# Patient Record
Sex: Female | Born: 1970 | Race: White | Hispanic: No | Marital: Single | Smoking: Former smoker
Health system: Southern US, Community
[De-identification: ages and names within clinical notes are randomized; demographics above are authoritative.]

## PROBLEM LIST (undated history)

## (undated) DIAGNOSIS — M1990 Unspecified osteoarthritis, unspecified site: Secondary | ICD-10-CM

## (undated) DIAGNOSIS — F419 Anxiety disorder, unspecified: Secondary | ICD-10-CM

## (undated) HISTORY — PX: ABDOMINAL HYSTERECTOMY: SHX81

## (undated) HISTORY — DX: Anxiety disorder, unspecified: F41.9

## (undated) HISTORY — DX: Unspecified osteoarthritis, unspecified site: M19.90

---

## 2005-05-04 ENCOUNTER — Emergency Department: Payer: Self-pay | Admitting: Emergency Medicine

## 2008-06-01 ENCOUNTER — Emergency Department: Payer: Self-pay | Admitting: Emergency Medicine

## 2012-01-24 ENCOUNTER — Ambulatory Visit: Payer: Self-pay | Admitting: Family Medicine

## 2013-11-17 ENCOUNTER — Ambulatory Visit (INDEPENDENT_AMBULATORY_CARE_PROVIDER_SITE_OTHER): Payer: No Typology Code available for payment source | Admitting: Podiatry

## 2013-11-17 ENCOUNTER — Encounter (INDEPENDENT_AMBULATORY_CARE_PROVIDER_SITE_OTHER): Payer: Self-pay

## 2013-11-17 ENCOUNTER — Encounter: Payer: Self-pay | Admitting: Podiatry

## 2013-11-17 VITALS — BP 114/69 | HR 64 | Resp 16 | Ht 64.0 in | Wt 170.0 lb

## 2013-11-17 DIAGNOSIS — M6740 Ganglion, unspecified site: Secondary | ICD-10-CM

## 2013-11-17 DIAGNOSIS — M79609 Pain in unspecified limb: Secondary | ICD-10-CM

## 2013-11-17 DIAGNOSIS — B079 Viral wart, unspecified: Secondary | ICD-10-CM

## 2013-11-17 NOTE — Patient Instructions (Signed)

## 2013-11-17 NOTE — Progress Notes (Signed)
   Subjective:    Patient ID: Rachel Lewis, female    DOB: 1970/07/29, 43 y.o.   MRN: [id number]  HPI Comments: i have a place on the bottom of my big toe on my rt foot. Ive had it for over 1 year. Its getting worse. It hurts with pressure and when it gets bigger. i shave it down and i used otc corn remover stuff.  Foot Pain Associated symptoms include coughing.      Review of Systems  Respiratory: Positive for cough.   All other systems reviewed and are negative.      Objective:   Physical Exam        Assessment & Plan:

## 2013-11-17 NOTE — Progress Notes (Signed)
Subjective:     Patient ID: Rachel Lewis, female   DOB: 1971-01-23, 43 y.o.   MRN: [id number]  Foot Pain   patient presents stating I have this bottom my big toe that's been sore and driving me crazy for the last year and also I have this lump on top of my foot that makes it hard for me to wear certain types of shoes that's been present for several years   Review of Systems  All other systems reviewed and are negative.      Objective:   Physical Exam  Nursing note and vitals reviewed. Constitutional: She is oriented to person, place, and time.  Cardiovascular: Intact distal pulses.   Musculoskeletal: Normal range of motion.  Neurological: She is oriented to person, place, and time.  Skin: Skin is warm.   neurovascular status intact with muscle strength adequate and range of motion subtalar and midtarsal joint within normal limits. 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 I've recommended excision of the mass right hallux do to its solitary appearance and drainage of the mass on the dorsum right foot I infiltrated the right hallux 60 mg Xylocaine Marcaine mixture I excised the lesion in toto and he did have appearance of verruca but I placed in formalin and sent for pathological evaluation I applied a small amount of phenol to the base and applied sterile dressing to the dorsal area I did do a proximal nerve block and aspirated with 10 cc syringe and was able to aspirate a cc of gelatinous material consistent with ganglionic cyst and applied sterile dressing to the area with compression  area reappoint as needed and I will go over pathology when received

## 2013-11-18 ENCOUNTER — Telehealth: Payer: Self-pay | Admitting: *Deleted

## 2013-11-18 NOTE — Telephone Encounter (Signed)
Spoke to patient, told patient to continue with her soaking, and take ibuprofen or tylenol , that it should start to feel better if it does not get any better to call us back

## 2013-11-18 NOTE — Telephone Encounter (Signed)
Left message for patient to return call, Rachel Lewis had sent message over that patient had called and dr Charlsie Merlesregal did a procedure yesterday ,and that she was in pain all last night and did not know what to do .

## 2013-12-29 ENCOUNTER — Encounter: Payer: Self-pay | Admitting: Podiatry

## 2014-09-14 DIAGNOSIS — G5793 Unspecified mononeuropathy of bilateral lower limbs: Secondary | ICD-10-CM | POA: Insufficient documentation

## 2014-09-14 DIAGNOSIS — F5104 Psychophysiologic insomnia: Secondary | ICD-10-CM | POA: Insufficient documentation

## 2015-09-28 ENCOUNTER — Other Ambulatory Visit: Payer: Self-pay | Admitting: Neurology

## 2015-09-28 DIAGNOSIS — M79605 Pain in left leg: Principal | ICD-10-CM

## 2015-09-28 DIAGNOSIS — R200 Anesthesia of skin: Secondary | ICD-10-CM

## 2015-09-28 DIAGNOSIS — R202 Paresthesia of skin: Secondary | ICD-10-CM

## 2015-09-28 DIAGNOSIS — M79604 Pain in right leg: Secondary | ICD-10-CM

## 2015-10-19 ENCOUNTER — Ambulatory Visit
Admission: RE | Admit: 2015-10-19 | Discharge: 2015-10-19 | Disposition: A | Discharge status: Home / Self Care | Payer: BLUE CROSS/BLUE SHIELD | Source: Ambulatory Visit | Attending: Neurology | Admitting: Neurology

## 2015-10-19 DIAGNOSIS — M79604 Pain in right leg: Secondary | ICD-10-CM | POA: Diagnosis present

## 2015-10-19 DIAGNOSIS — M4696 Unspecified inflammatory spondylopathy, lumbar region: Secondary | ICD-10-CM | POA: Diagnosis not present

## 2015-10-19 DIAGNOSIS — R202 Paresthesia of skin: Secondary | ICD-10-CM | POA: Diagnosis not present

## 2015-10-19 DIAGNOSIS — M5136 Other intervertebral disc degeneration, lumbar region: Secondary | ICD-10-CM | POA: Insufficient documentation

## 2015-10-19 DIAGNOSIS — M79605 Pain in left leg: Secondary | ICD-10-CM | POA: Diagnosis present

## 2015-10-19 DIAGNOSIS — R200 Anesthesia of skin: Secondary | ICD-10-CM | POA: Diagnosis present

## 2016-01-26 ENCOUNTER — Encounter: Payer: Self-pay | Admitting: Physical Therapy

## 2016-01-26 ENCOUNTER — Ambulatory Visit: Payer: BLUE CROSS/BLUE SHIELD | Attending: Obstetrics and Gynecology | Admitting: Physical Therapy

## 2016-01-26 DIAGNOSIS — R2689 Other abnormalities of gait and mobility: Secondary | ICD-10-CM | POA: Diagnosis present

## 2016-01-26 DIAGNOSIS — M791 Myalgia, unspecified site: Secondary | ICD-10-CM

## 2016-01-26 NOTE — Patient Instructions (Signed)
  Proper body mechanics with getting out of a chair to decrease strain on back  Avoid holding your breath when Getting out of the chair:  Scoot to front part of chair chair Heels behind feet, nose over toes  Inhale like you are smelling roses Exhale to stand    ______  Belly massage (gentle and fingers flat)  3 half circle upward clockwise starting at low R corner by hip  X 3 sets  3 strokes (triangle from hips to belly button) x 3 sets  Breathing like you are smelling soup to work the diaphragm/ relax the pelvic floor)   Zig zag along the scar   ______  Figure -4 stretch R ankle over L L leg straight  3 breaths at work  X 3 days

## 2016-01-27 NOTE — Therapy (Addendum)
[place] [facility] MAIN [facility] SERVICES [address] [place], [place], [address] Phone: [phone]   Fax:  [phone]  Physical Therapy Evaluation  Patient Details  Name: Rachel Lewis MRN: [id number] Date of Birth: 08/03/1970 Referring Provider: Dalbert Garnet  Encounter Date: 01/26/2016      PT End of Session - 02/01/16 1259    Visit Number 1   Number of Visits 12   PT Start Time 1600   PT Stop Time 1700   PT Time Calculation (min) 60 min   Activity Tolerance Patient tolerated treatment well;No increased pain   Behavior During Therapy WFL for tasks assessed/performed      Past Medical History:  Diagnosis Date  . Arthritis    back    Past Surgical History:  Procedure Laterality Date  . ABDOMINAL HYSTERECTOMY     partial   . CESAREAN SECTION      There were no vitals filed for this visit.       Subjective Assessment - 02/01/16 1257    Subjective 1) Pt reported groin/ leg pain on her left side that started 2 years that just came on. Pain at its worst 7/10 with laying in bed and unexpected movements. Leg  "gives out" 3x/ week but has not caused her to fall. Currently pt has been on medication has helped the pain to decrease to 2/10. The leg does not give out on her anymore. Pt would like to wean off o fhte medication and is working with her MD to do so. The pain radiates down R groin and posterior above the knee at the same time and sometimes it occurs separately. 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  Denied urinary incontinence. 2) Pt had constipation prior to this pain. Pt takes Linzess only 2 x week because she does not want to go regularly while working. Pt reports straining.      Pertinent History 7 days in labor and excessive pushing, and one-sided numbness from an epidermal, and her "insides were swelling over her baby's head" which led to an emergency C-section because her baby was no breathing (  1993),  Pt was not able to walk until 12 hr after her labor due to residual numbness from her epidural.    Patient Stated Goals To have legs not stop hurting and stop taking her medicine            [facility] PT Assessment - 02/01/16 1257      Assessment   Medical Diagnosis pelvic pain    Referring Provider Dalbert Garnet     Precautions   Precautions None     Restrictions   Weight Bearing Restrictions No     Balance Screen   Has the patient fallen in the past 6 months No     Prior Function   Level of Independence Independent     Observation/Other Assessments   Observations slumped posture   Other Surveys  --  PDI 6%      Squat   Comments proper form     Sit to Stand   Comments breathholding, poor alignment, grimacing with pain     Posture/Postural Control   Posture Comments lumbopelvic perturbations with leg movements     Palpation   Palpation comment increased scar restrictions over low abdomen with referred pain to back and groin     Bed Mobility   Bed Mobility --  half crunch  [facility] Adult PT Treatment/Exercise - 02/01/16 1257      Therapeutic Activites    Therapeutic Activities --  see pt instructions     Manual Therapy   Manual therapy comments scar massage                      PT Long Term Goals - 02/01/16 1304      PT LONG TERM GOAL #1   Title Pt will decrease her PDI score from  6 %  to < 3 % in order to return to walking after at the end of the work    Time 12   Period Weeks   Status New     PT LONG TERM GOAL #2   Title Pt will report decreased numbness by 50% in intensity  in L leg within < 1 hr of sitting in order to wathching a movie   Time 12   Period Weeks   Status New     PT LONG TERM GOAL #3   Title Pt will demo decreased scar restrictions over her abdomen in orer to promote mobility of her hips to walk and lift trays at work   Time 12   Period Weeks   Status New     PT LONG TERM GOAL #4    Title Pt will report her R leg no longer gives out on her across 2 weeks in order to demo improved strength and to decrease risk for falls.   Time 12   Period Weeks   Status New               Plan - 02/01/16 1303    Clinical Impression Statement Pt is a 45 yo female who c/o R hip/groin pain and constipation which impact her job tasks and QOL. Pt's clinical presentations include abdominal scar restrictions, dyscoordination and strength of deep core mm, and poor body mechanics which places load on her spine and pelvic floor. Pt's hip/groin pain were found to be associated with abdominal scar restrictions and pelvic floor hyperactivity.  Pt's pain with palpation at bulbospongiosus, obt int, ischiocavernosus decreased 10/10 to 7/10 following Tx. Pt also demo'd proper coordination of deep core mm and body mechanics with sit to stand and out of bed t/f.     Rehab Potential Good   PT Frequency 1x / week   PT Duration 12 weeks   PT Treatment/Interventions ADLs/Self Care Home Management;Aquatic Therapy;Functional mobility training;Stair training;Gait training;Neuromuscular re-education;Patient/family education;Manual techniques;Therapeutic exercise;Therapeutic activities;Balance training;Moist Heat;Passive range of motion;Scar mobilization   Consulted and Agree with Plan of Care Patient      Patient will benefit from skilled therapeutic intervention in order to improve the following deficits and impairments:  Abnormal gait, Increased muscle spasms, Decreased safety awareness, Decreased endurance, Decreased activity tolerance, Pain, Increased fascial restricitons, Difficulty walking, Decreased range of motion, Decreased coordination, Decreased balance, Decreased scar mobility, Hypomobility, Decreased mobility, Decreased strength, Postural dysfunction, Improper body mechanics, Impaired flexibility  Visit Diagnosis: Myalgia  Other abnormalities of gait and mobility     Problem List There are  no active problems to display for this patient.   Mariane MastersYeung,Shin Yiing ,PT, DPT, E-RYT  02/01/2016, 1:05 PM   [facility] [address] [place], [place], [address] Phone: [phone]   Fax:  [phone]  Name: Marshia Lyngela A Ellers MRN: [id number] Date of Birth: 04-20-1970

## 2016-02-01 NOTE — Addendum Note (Signed)
Addended by: Mariane MastersYEUNG, SHIN-YIING on: 02/01/2016 01:06 PM   Modules accepted: Orders

## 2016-02-02 ENCOUNTER — Encounter: Payer: BLUE CROSS/BLUE SHIELD | Admitting: Physical Therapy

## 2016-02-09 ENCOUNTER — Ambulatory Visit: Payer: BLUE CROSS/BLUE SHIELD | Attending: Obstetrics and Gynecology | Admitting: Physical Therapy

## 2016-02-09 DIAGNOSIS — R2689 Other abnormalities of gait and mobility: Secondary | ICD-10-CM | POA: Insufficient documentation

## 2016-02-09 DIAGNOSIS — M791 Myalgia, unspecified site: Secondary | ICD-10-CM

## 2016-02-09 DIAGNOSIS — M25512 Pain in left shoulder: Secondary | ICD-10-CM | POA: Diagnosis present

## 2016-02-09 DIAGNOSIS — G8929 Other chronic pain: Secondary | ICD-10-CM | POA: Insufficient documentation

## 2016-02-09 NOTE — Patient Instructions (Signed)
You are now ready to begin training the deep core muscles system: diaphragm, transverse abdominis, pelvic floor . These muscles must work together as a team.           The key to these exercises to train the brain to coordinate the timing of these muscles and to have them turn on for long periods of time to hold you upright against gravity (especially important if you are on your feet all day).These muscles are postural muscles and play a role stabilizing your spine and bodyweight. By doing these repetitions slowly and correctly instead of doing crunches, you will achieve a flatter belly without a lower pooch. You are also placing your spine in a more neutral position and breathing properly which in turn, decreases your risk for problems related to your pelvic floor, abdominal, and low back such as pelvic organ prolapse, hernias, diastasis recti (separation of superficial muscles), disk herniations, spinal fractures. These exercises set a solid foundation for you to later progress to resistance/ strength training with therabands and weights and return to other typical fitness exercises with a stronger deeper core.   Do level 1 : 10 reps Do level 2: 10 reps (left and right = 1 rep) x 3 sets , 2 x day Do not progress to level 3 for 3-4 weeks. You know you are ready when you do not have any rocking of pelvis nor arching in your back    LOG ROLLING out of bed and  ALSO squeeze pelvic floor and abdominal before your cough / sneeze

## 2016-02-10 NOTE — Therapy (Addendum)
[place] [facility] MAIN REHAB SERVICES [address] [place], [place], [address] Phone: [phone]   Fax:  [phone]  Physical Therapy Treatment  Patient Details  Name: Rachel Lewis MRN: [id number] Date of Birth: 05/12/1970 Referring Provider: Beasley  Encounter Date: 02/09/2016      PT End of Session - 02/10/16 1527    Visit Number 2   Number of Visits 12   PT Start Time 1600   PT Stop Time 1700   PT Time Calculation (min) 60 min   Activity Tolerance Patient tolerated treatment well;No increased pain   Behavior During Therapy WFL for tasks assessed/performed      Past Medical History:  Diagnosis Date  . Arthritis    back    Past Surgical History:  Procedure Laterality Date  . ABDOMINAL HYSTERECTOMY     partial   . CESAREAN SECTION      There were no vitals filed for this visit.      Subjective Assessment - 02/09/16 1611    Subjective Pt has stopped taking one of her pain medications and she feels she does not hurt as much any more. She feels 20% better.  Pt plan to contact her MDs regarding weaning off of her meds.  Pt has not felt any L radiating pain nor have felt her leg "giving out" on her since last session one week ago. Pt's constipation has improved from once a month to every other day and she no longer takes Linzess.      Pertinent History 7 days in labor and excessive pushing, and one-sided numbness from an epidermal, and her "insides were swelling over her baby's head" which led to an emergency C-section because her baby was no breathing ( 1993),  Pt was not able to walk until 12 hr after her labor due to residual numbness from her epidural.    Patient Stated Goals To have legs not stop hurting and stop taking her medicine            [facility] PT Assessment - 02/09/16 1657      Palpation   Palpation comment decreased scar restrictions                  Pelvic Floor Special Questions - 02/09/16 1656    Pelvic Floor Internal Exam pt consented verbally without contraindications   Exam Type Vaginal   Palpation increased tensions R ilio/ ischiococcygeus > L,  L obt int     Strength --  lengthening achieved with vical and tactil cuing                        PT Long Term Goals - 02/09/16 1622      PT LONG TERM GOAL #1   Title Pt will decrease her PDI score from  6 %  to < 3 % in order to return to walking after at the end of the work    Time 12   Period Weeks   Status New     PT LONG TERM GOAL #2   Title Pt will report decreased numbness by 50% in intensity  in L leg within < 1 hr of sitting in order to wathching a movie   Time 12   Period Weeks   Status Achieved     PT LONG TERM GOAL #3   Title Pt will demo decreased scar restrictions over her abdomen in orer to promote mobility of her hips   to walk and lift trays at work   Time 12   Period Weeks   Status Achieved     PT LONG TERM GOAL #4   Title Pt will report her L leg no longer gives out on her across 2 weeks in order to demo improved strength and to decrease risk for falls.   Time 12   Period Weeks   Status Partially Met     PT LONG TERM GOAL #5   Title Pt will report sitting for < 1.5  hrs in a car ride with decreased pain by 50% in order to travel before pit stops   Time 12   Period Weeks   Status New               Plan - 02/09/16 1621    Clinical Impression Statement Pt has not felt any L radiating pain nor have felt her leg "giving out" on her since last session one week ago. Pt's constipation has improved from once a month to every other day and she no longer takes Linzess. Today, pt showed decreased pelvic floor tensions following internal manual Tx. Pt continues to benefit from skilled PT.    Rehab Potential Good   PT Frequency 1x / week   PT Duration 12 weeks   PT Treatment/Interventions ADLs/Self Care Home Management;Aquatic Therapy;Functional mobility training;Stair training;Gait  training;Neuromuscular re-education;Patient/family education;Manual techniques;Therapeutic exercise;Therapeutic activities;Balance training;Moist Heat;Passive range of motion;Scar mobilization   Consulted and Agree with Plan of Care Patient      Patient will benefit from skilled therapeutic intervention in order to improve the following deficits and impairments:  Abnormal gait, Increased muscle spasms, Decreased safety awareness, Decreased endurance, Decreased activity tolerance, Pain, Increased fascial restricitons, Difficulty walking, Decreased range of motion, Decreased coordination, Decreased balance, Decreased scar mobility, Hypomobility, Decreased mobility, Decreased strength, Postural dysfunction, Improper body mechanics, Impaired flexibility  Visit Diagnosis: Myalgia  Other abnormalities of gait and mobility     Problem List There are no active problems to display for this patient.   ,Shin Yiing ,PT, DPT, E-RYT  02/10/2016, 3:28 PM  [facility] [facility] MAIN REHAB SERVICES [address] [place], [place], [address] Phone: [phone]   Fax:  [phone]  Name: Vertie A Aitken MRN: [id number] Date of Birth: 06/15/1970    

## 2016-02-16 ENCOUNTER — Ambulatory Visit: Payer: BLUE CROSS/BLUE SHIELD | Admitting: Physical Therapy

## 2016-02-16 DIAGNOSIS — M791 Myalgia, unspecified site: Secondary | ICD-10-CM

## 2016-02-16 DIAGNOSIS — R2689 Other abnormalities of gait and mobility: Secondary | ICD-10-CM

## 2016-02-16 NOTE — Therapy (Signed)
[place] MAIN [facility] SERVICES [address] [place], [place], [address] Phone: [phone]   Fax:  [phone]  Physical Therapy Treatment  Patient Details  Name: Rachel Lewis MRN: [id number] Date of Birth: 1970-10-26 Referring Provider: Leafy Ro  Encounter Date: 02/16/2016      PT End of Session - 02/16/16 1704    Visit Number 3   Number of Visits 12   PT Start Time 3244   PT Stop Time 0102   PT Time Calculation (min) 61 min   Activity Tolerance Patient tolerated treatment well;No increased pain   Behavior During Therapy WFL for tasks assessed/performed      Past Medical History:  Diagnosis Date  . Arthritis    back    Past Surgical History:  Procedure Laterality Date  . ABDOMINAL HYSTERECTOMY     partial   . CESAREAN SECTION      There were no vitals filed for this visit.      Subjective Assessment - 02/16/16 1608    Subjective Pt has not had any radiating LBP for the past 2 weeks. Pt talked to her MDs and they said pt can wean off o fthe meds. After last session, pt felt "good" and slept well. Across the past week, pt had one night where her pain felt intense. Two days ago, pt noticed a sharp pain in the stomach that comes and goes and she also notices her back hurts as well but has not radiating pain. Pt had daily bowel movements the past week except yesterday because she ate cheese.      Pertinent History 7 days in labor and excessive pushing, and one-sided numbness from an epidermal, and her "insides were swelling over her baby's head" which led to an emergency C-section because her baby was no breathing ( 1993),  Pt was not able to walk until 12 hr after her labor due to residual numbness from her epidural.             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      Palpation   SI assessment  increased mm/ fascial tensions over iliac crest  medial and lateral edge into glut medius R   iliacus and glut med R   Palpation comment decreased scar restrictions                     [facility] Adult PT Treatment/Exercise - 02/16/16 1653      Therapeutic Activites    Therapeutic Activities --  see pt instructions     Manual Therapy   Manual therapy comments STM , fascial release, lower trunk twist, on R iliac crest medial and lateral edge                 PT Education - 02/16/16 1704    Education provided Yes   Education Details HEP   Person(s) Educated Patient   Methods Explanation;Demonstration;Tactile cues;Verbal cues;Handout   Comprehension Verbalized understanding;Returned demonstration             PT Long Term Goals - 02/09/16 1622      PT LONG TERM GOAL #1   Title Pt will decrease her Wright score from  6 %  to < 3 % in order to return to walking after at the end of the work    Time 12   Period Weeks  Status New     PT LONG TERM GOAL #2   Title Pt will report decreased numbness by 50% in intensity  in L leg within < 1 hr of sitting in order to wathching a movie   Time 12   Period Weeks   Status Achieved     PT LONG TERM GOAL #3   Title Pt will demo decreased scar restrictions over her abdomen in orer to promote mobility of her hips to walk and lift trays at work   Time 12   Period Weeks   Status Achieved     PT LONG TERM GOAL #4   Title Pt will report her L leg no longer gives out on her across 2 weeks in order to demo improved strength and to decrease risk for falls.   Time 12   Period Weeks   Status Partially Met     PT LONG TERM GOAL #5   Title Pt will report sitting for < 1.5  hrs in a car ride with decreased pain by 50% in order to travel before pit stops   Time 12   Period Weeks   Status New               Plan - 02/16/16 1705    Clinical Impression Statement Pt reports feeling 80% improvement with her R hip sharp pain and LBP. Pt demo'd decreased fascial and mmt  ensions along iliacus and glut med on R following Tx. Pt demo'd proper body mechanics with work activities w/ lifting to minimize  back strain.  Pt will continue to require skilled PT.     Rehab Potential Good   PT Frequency 1x / week   PT Duration 12 weeks   PT Treatment/Interventions ADLs/Self Care Home Management;Aquatic Therapy;Functional mobility training;Stair training;Gait training;Neuromuscular re-education;Patient/family education;Manual techniques;Therapeutic exercise;Therapeutic activities;Balance training;Moist Heat;Passive range of motion;Scar mobilization   Consulted and Agree with Plan of Care Patient      Patient will benefit from skilled therapeutic intervention in order to improve the following deficits and impairments:  Abnormal gait, Increased muscle spasms, Decreased safety awareness, Decreased endurance, Decreased activity tolerance, Pain, Increased fascial restricitons, Difficulty walking, Decreased range of motion, Decreased coordination, Decreased balance, Decreased scar mobility, Hypomobility, Decreased mobility, Decreased strength, Postural dysfunction, Improper body mechanics, Impaired flexibility  Visit Diagnosis: Myalgia  Other abnormalities of gait and mobility     Problem List There are no active problems to display for this patient.   Jerl Mina ,PT, DPT, E-RYT  02/16/2016, 5:08 PM  [place] MAIN [facility] SERVICES [address] [place], [place], [address] Phone: [phone]   Fax:  [phone]  Name: Rachel Lewis MRN: [id number] Date of Birth: 06/07/1970

## 2016-02-16 NOTE — Patient Instructions (Addendum)
Lower trunk twist :  Lift hips up, scoot to the R, drop knees to L  5 breaths    _________  Continue with 30 reps of deep core level  2 x day   ________  Exhale with lifting trays at work from mini squat or semi tandem stance (one leg  forward knee above ankle) and have equal weight on both legs  Practice mini squat 10 reps  Exhale thru the nose on rise

## 2016-02-28 ENCOUNTER — Ambulatory Visit: Payer: BLUE CROSS/BLUE SHIELD | Admitting: Physical Therapy

## 2016-02-28 DIAGNOSIS — G8929 Other chronic pain: Secondary | ICD-10-CM

## 2016-02-28 DIAGNOSIS — R2689 Other abnormalities of gait and mobility: Secondary | ICD-10-CM

## 2016-02-28 DIAGNOSIS — M791 Myalgia, unspecified site: Secondary | ICD-10-CM

## 2016-02-28 DIAGNOSIS — M25512 Pain in left shoulder: Secondary | ICD-10-CM

## 2016-02-28 NOTE — Patient Instructions (Signed)
  Clam Shell 45 Degrees   Lying on Right side  with hips and knees bent 45, one pillow between knees and ankles. Lift knee with exhale. Be sure pelvis does not roll backward. Do not arch back. Do 10 times, each leg, 2 times per day.   Left leg only     http://ss.exer.us/75   Copyright  VHI. All rights reserved.   ____________________  Royetta CarSidestepping and mini squat 20 ft x 2 day  Make sure you can still see your toes in mini squat, exhale as you rise   ____________________  Standing with red band at ankles, hand on wall, facing perpendicular to wall.  Step toe tips in a diagonal pattern while standing on other leg (unlocked knee)   10 reps x 2 sets each leg

## 2016-02-29 NOTE — Therapy (Addendum)
[facility] [address] [place], [place], [address] Phone: [phone]   Fax:  [phone]  Physical Therapy Treatment/ Progress Note   Patient Details  Name: Rachel Lewis MRN: [id number] Date of Birth: 1971-03-28 Referring Provider: Dalbert GarnetBeasley  Encounter Date: 02/28/2016      PT End of Session - 02/28/16 1646    Visit Number 4   Number of Visits 12   PT Start Time 1615   PT Stop Time 1645   PT Time Calculation (min) 30 min   Activity Tolerance Patient tolerated treatment well;No increased pain   Behavior During Therapy WFL for tasks assessed/performed      Past Medical History:  Diagnosis Date  . Arthritis    back    Past Surgical History:  Procedure Laterality Date  . ABDOMINAL HYSTERECTOMY     partial   . CESAREAN SECTION      There were no vitals filed for this visit.      Subjective Assessment - 02/28/16 1701    Subjective Pt has not had any pain the past week and she has decreased her pain medications as guided by her MD. Pt expressed her excitement bbecause she is no longer feeling side effectrs from her medications is no longer affecting her memory at work. Pt is pleased. Pt states she has to leave early from today's appt .    Pertinent History 7 days in labor and excessive pushing, and one-sided numbness from an epidermal, and her "insides were swelling over her baby's head" which led to an emergency C-section because her baby was no breathing ( 1993),  Pt was not able to walk until 12 hr after her labor due to residual numbness from her epidural.             [facility] - 02/29/16 1023      Squat   Comments requried cues for proper form     Strength   Overall Strength Comments hip abd 4/5 L, 5/5 R. glut max 5/5 B , thoracolumbar with scaption L UE limited in full ROM into scaption due to pt's c/o shoulder issues for over a year                     [facility] PT  Treatment/Exercise - 02/29/16 1021      Therapeutic Activites    Therapeutic Activities --  see pt instruction     Neuro Re-ed    Neuro Re-ed Details  see pt instruction                PT Education - 02/28/16 1646    Education provided Yes   Education Details HEP   Person(s) Educated Patient   Methods Explanation;Demonstration;Tactile cues;Verbal cues;Handout   Comprehension Verbalized understanding;Returned demonstration             PT Long Term Goals - 02/28/16 1624      PT LONG TERM GOAL #1   Title Pt will decrease her PDI score from  6 %  to < 3 % in order to return to walking after at the end of the work  (11/21: 0%)    Time 12   Period Weeks   Status Achieved     PT LONG TERM GOAL #2   Title Pt will report decreased numbness by 50% in intensity  in L leg within < 1 hr of sitting in order to wathching a movie  Time 12   Period Weeks   Status Achieved     PT LONG TERM GOAL #3   Title Pt will demo decreased scar restrictions over her abdomen in orer to promote mobility of her hips to walk and lift trays at work   Time 12   Period Weeks   Status Achieved     PT LONG TERM GOAL #4   Title Pt will report her L leg no longer gives out on her across 2 weeks in order to demo improved strength and to decrease risk for falls.   Time 12   Period Weeks   Status Achieved     PT LONG TERM GOAL #5   Title Pt will report sitting for > 1.5  hrs in a car ride with decreased pain by 50% in order to travel before pit stops   Time 12   Period Weeks   Status Achieved     Additional Long Term Goals   Additional Long Term Goals Yes     PT LONG TERM GOAL #6   Title Pt will demo full shoulder abduction ROM from ~140 deg to 180 deg  on LUE in order to progress to thoralumbar strengthening to lift trays repeated at work   Time 12   Period Weeks   Status New               Plan - 02/29/16 1025    Clinical Impression Statement Pt has achieved 5/6 goals. Pt  had no pain across the past week and no radiating pain across the past 3 weeks.Pt states she has decreased her medications as guided by her MD. Pt is progressing well towards her goals and continues to require skilled PT to build her strength in her LLE/ hip.  PT added L shoulder pain to her recert because pt demo'd ROM  limitation with thoracolumbar strengthen strengthening exercises. Plan to assess next visit the cause for her L shoulder pain and ROM limitation. 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    Rehab Potential Good   PT Frequency 1x / week   PT Duration 12 weeks   PT Treatment/Interventions ADLs/Self Care Home Management;Aquatic Therapy;Functional mobility training;Stair training;Gait training;Neuromuscular re-education;Patient/family education;Manual techniques;Therapeutic exercise;Therapeutic activities;Balance training;Moist Heat;Passive range of motion;Scar mobilization   Consulted and Agree with Plan of Care Patient      Patient will benefit from skilled therapeutic intervention in order to improve the following deficits and impairments:  Abnormal gait, Increased muscle spasms, Decreased safety awareness, Decreased endurance, Decreased activity tolerance, Pain, Increased fascial restricitons, Difficulty walking, Decreased range of motion, Decreased coordination, Decreased balance, Decreased scar mobility, Hypomobility, Decreased mobility, Decreased strength, Postural dysfunction, Improper body mechanics, Impaired flexibility  Visit Diagnosis: Myalgia  Other abnormalities of gait and mobility  Chronic left shoulder pain     Problem List There are no active problems to display for this patient.   Mariane MastersYeung,Shin Yiing ,PT, DPT, E-RYT  02/29/2016, 10:27 AM  [facility] [address] [place], [place], [address] Phone: [phone]   Fax:   [phone]  Name: Rachel Lyngela A Mura MRN: [id number] Date of Birth: January 08, 1971

## 2016-03-15 ENCOUNTER — Ambulatory Visit: Payer: BLUE CROSS/BLUE SHIELD | Attending: Obstetrics and Gynecology | Admitting: Physical Therapy

## 2016-03-15 DIAGNOSIS — R2689 Other abnormalities of gait and mobility: Secondary | ICD-10-CM | POA: Insufficient documentation

## 2016-03-15 DIAGNOSIS — G8929 Other chronic pain: Secondary | ICD-10-CM | POA: Insufficient documentation

## 2016-03-15 DIAGNOSIS — M25512 Pain in left shoulder: Secondary | ICD-10-CM | POA: Insufficient documentation

## 2016-03-15 DIAGNOSIS — M791 Myalgia, unspecified site: Secondary | ICD-10-CM

## 2016-03-15 NOTE — Therapy (Signed)
[place] MAIN [facility] SERVICES [address] [place], [place], [address] Phone: [phone]   Fax:  [phone]  Physical Therapy Treatment  Patient Details  Name: Rachel Lewis MRN: [id number] Date of Birth: [birth year] Referring Provider: Leafy Ro  Encounter Date: 03/15/2016      PT End of Session - 03/15/16 2058    Visit Number 4   Number of Visits 12   Date for PT Re-Evaluation 05/30/16   PT Start Time 4235   PT Stop Time 1655   PT Time Calculation (min) 50 min   Activity Tolerance Patient tolerated treatment well;No increased pain   Behavior During Therapy WFL for tasks assessed/performed      Past Medical History:  Diagnosis Date  . Arthritis    back    Past Surgical History:  Procedure Laterality Date  . ABDOMINAL HYSTERECTOMY     partial   . CESAREAN SECTION      There were no vitals filed for this visit.      Subjective Assessment - 03/15/16 1612    Subjective Pt has no pain for the past two weeks and she has been off of her pain medication for one week.    Pertinent History 7 days in labor and excessive pushing, and one-sided numbness from an epidermal, and her "insides were swelling over her baby's head" which led to an emergency C-section because her baby was no breathing ( 1993),  Pt was not able to walk until 12 hr after her labor due to residual numbness from her epidural.             [facility] PT Assessment - 03/15/16 1635      ROM / Strength   AROM / PROM / Strength --  supine: UE shoulder 90deg pre Tx, 120 deg post Tx     AROM   Overall AROM Comments seated: unable to place hand behind back      Palpation   Spinal mobility significantly increased mm tensions along upper trap, levator, scalenes on L and p! with inferior mob at 1st rib, increased mm tensions at midtrap and lower trap  and paraspinals at thoracic spine L > R       Hawkins-Kennedy test   Findings Negative     Lift-Off test   Findings  Negative     Empty Can test   Findings Negative     Full Can test   Findings Negative                     [facility] Adult PT Treatment/Exercise - 03/15/16 1652      Therapeutic Activites    Therapeutic Activities --  see pt instructions     Modalities   Modalities --  heat pack 7 min over back, skin intact post Tx while pt was educated on open book exercise     Manual Therapy   Manual therapy comments inferior mob at 1st rib, distraction at lower cervical spine, STM with MWM at mid/lower trap/ and paraspinals                 PT Education - 03/15/16 2058    Education provided Yes   Education Details HEP   Person(s) Educated Patient   Methods Explanation;Demonstration;Tactile cues;Verbal cues;Handout   Comprehension Returned demonstration;Verbalized understanding             PT Long Term Goals - 03/15/16 1703      PT LONG TERM GOAL #  1   Title Pt will decrease her Plato score from  6 %  to < 3 % in order to return to walking after at the end of the work  (11/21: 0%)    Time 12   Period Weeks   Status Achieved     PT LONG TERM GOAL #2   Title Pt will report decreased numbness by 50% in intensity  in L leg within < 1 hr of sitting in order to wathching a movie   Time 12   Period Weeks   Status Achieved     PT LONG TERM GOAL #3   Title Pt will demo decreased scar restrictions over her abdomen in orer to promote mobility of her hips to walk and lift trays at work   Time 12   Period Weeks   Status Achieved     PT LONG TERM GOAL #4   Title Pt will report her L leg no longer gives out on her across 2 weeks in order to demo improved strength and to decrease risk for falls.   Time 12   Period Weeks   Status Achieved     PT LONG TERM GOAL #5   Title Pt will report sitting for > 1.5  hrs in a car ride with decreased pain by 50% in order to travel before pit stops   Time 12   Period Weeks   Status Achieved     Additional Long Term Goals    Additional Long Term Goals Yes     PT LONG TERM GOAL #6   Title Pt will demo full shoulder abduction ROM from ~140 deg to 180 deg  on LUE in order to progress to thoralumbar strengthening to lift trays repeated at work   Time 12   Period Weeks   Status Partially Met     PT LONG TERM GOAL #7   Title Pt will be able to place her L hand behind back at the level of L1 or level in order to don bra    Time 12   Period Weeks   Status New     PT LONG TERM GOAL #8   Title Pt will decrease her quick dash score from 57 pts to < 50pt in order to return to ADLs at home and at work   Time [place] - 03/15/16 2059    Clinical Impression Statement Pt continues to report of no pain for 2 weeks now and has not needed to take her pain medication for 1 week. Addressed pt's limited shoulder abduction ROM and shoulder pain today. Rotator cuff or capsular injury is unlikely 2/2 negative special test. 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 Following manual Tx that addressed decreasing mm tensions, pt demo'd increased ROM from ~90d eg to ~120 deg on LUE.  Pt will continue to require skilled PT to help pt  increase ROM to reach behind back and to increase ROM further for functional activities. Pt will also required PT to strengthen shoulder/back complex and proper shoulder motor control w/ lifting to minimize overuse.  Plan to reassess hip strength at next session. Pt will be ready to strengthen thoracolumbar system when pt shows decreased mm tensions in shoulders and back.   Rehab Potential Good   PT Frequency 1x / week  PT Duration 12 weeks   PT Treatment/Interventions ADLs/Self Care Home Management;Aquatic Therapy;Functional mobility training;Stair training;Gait training;Neuromuscular re-education;Patient/family education;Manual techniques;Therapeutic exercise;Therapeutic activities;Balance  training;Moist Heat;Passive range of motion;Scar mobilization   Consulted and Agree with Plan of Care Patient      Patient will benefit from skilled therapeutic intervention in order to improve the following deficits and impairments:  Abnormal gait, Increased muscle spasms, Decreased safety awareness, Decreased endurance, Decreased activity tolerance, Pain, Increased fascial restricitons, Difficulty walking, Decreased range of motion, Decreased coordination, Decreased balance, Decreased scar mobility, Hypomobility, Decreased mobility, Decreased strength, Postural dysfunction, Improper body mechanics, Impaired flexibility  Visit Diagnosis: Myalgia  Other abnormalities of gait and mobility  Chronic left shoulder pain     Problem List There are no active problems to display for this patient.   Jerl Mina ,PT, DPT, E-RYT  03/15/2016, 9:05 PM  [place] MAIN [facility] SERVICES [address] [place], [place], [address] Phone: [phone]   Fax:  [phone]  Name: Rachel Lewis MRN: [id number] Date of Birth: 02/16/1971

## 2016-03-15 NOTE — Patient Instructions (Signed)
  Midback muscle stretches:  ______________________________________  1. Open book (handout) with pillow between legs and behind back  15 reps   2. Cross over stretches On your back, knee back:  A. Hug yourself (each arm over other)  3 breaths     B. Wrist over other, interlace your hands, "brush" fists from belly upward towards chin, then sweep back down towards belly, straighten elbow and raise wrists towards ceiling to feel mib back muscles stretch  3x each side     3. Restorative pose  with yoga blocks to release midback back tensions   One block vertically placed between shoulder blades and the other is sitting on its longerside perpendicular to support back of the head   untuck the hips to lengthen the back   knees bent, feet hip width apart   Gently moves knees to L ~ 20 deg to  alow trunk to fall over edge of block and to feel muscles between the shoulder blades relax  Do other side with knees    4. Snow angles  Hands by your side, palms face up, dragging your whole arms over bed like you are making snow angels With breathing  5x

## 2016-03-29 ENCOUNTER — Ambulatory Visit: Payer: BLUE CROSS/BLUE SHIELD | Admitting: Physical Therapy

## 2016-03-29 DIAGNOSIS — G8929 Other chronic pain: Secondary | ICD-10-CM

## 2016-03-29 DIAGNOSIS — M791 Myalgia, unspecified site: Secondary | ICD-10-CM

## 2016-03-29 DIAGNOSIS — R2689 Other abnormalities of gait and mobility: Secondary | ICD-10-CM

## 2016-03-29 DIAGNOSIS — M25512 Pain in left shoulder: Secondary | ICD-10-CM

## 2016-03-29 NOTE — Patient Instructions (Addendum)
Seated stretches: in a seated position with feet planted on ground under knees  _Raising arm overhead, lower the shoulder down first then elbows  3 reps  _Self hug and then elbows overhead like you are taking off a sweater   _Press hand down to chair , shoulders squeeze together and downward, chin tucks , 3 sec, x 5 reps.    _____________  Zadie CleverlyLaying down: Snow angles: Shoulder pressed downward away from the ears, Chin back   This sequence gets your arm higher   10 reps   _____________ Functional position practice: Stand by the wall  Fingers against the wall Drag the fingers upwards   When lowering (like when you take a cup down from the shelf at work)   first lower shoulders away from the ears and then elbows down, hand lowers   5 reps both arms    _____________  Work stretch:  Warrior I : Feet are hip width apart, one behind like you are on ski tracks, front knee bent over ankle but not more forward then the ankle.  Make sure 50% weight is in the front foot/leg , 50% weight is the back foot/ leg   Arms up like "V" , drop shoulders down  Palms together at the hears  Extended side angle :  Feet are hip width apart, L foot one behind like you are on ski tracks,  R knee bent over ankle but not more forward then the ankle.  Make sure 50% weight is in the front foot/leg , 50% weight is the back foot/ leg    Rest R forearm lightly on top of thigh,  L hand on L hip.  Inhale lengthen spine,   Exhale turn navel to the L then the ribcage turns, look at the other wall.  Keep maintaining  50% weight is in the front foot/leg , 50% weight is the back foot/ leg  And make sure the front knee is still pointed in the toe line of the 2nd toe.   3 breaths here.    ____________  Try to sleep on your back, pillow under knees.   Keep pillow lined up by your side in case you roll over onto belly.

## 2016-03-30 NOTE — Therapy (Signed)
[place] MAIN [facility] SERVICES [address] [place], [place], [address] Phone: [phone]   Fax:  [phone]  Physical Therapy Treatment  Patient Details  Name: Rachel Lewis MRN: [id number] Date of Birth: April 21, 1970 Referring Provider: Leafy Ro  Encounter Date: 03/29/2016      PT End of Session - 03/29/16 1616    Visit Number 5   Date for PT Re-Evaluation 05/30/16   PT Start Time 3536   PT Stop Time 1443   PT Time Calculation (min) 52 min   Activity Tolerance Patient tolerated treatment well;No increased pain   Behavior During Therapy WFL for tasks assessed/performed      Past Medical History:  Diagnosis Date  . Arthritis    back    Past Surgical History:  Procedure Laterality Date  . ABDOMINAL HYSTERECTOMY     partial   . CESAREAN SECTION      There were no vitals filed for this visit.      Subjective Assessment - 03/29/16 1617    Subjective Pt has reported she no longer takes medication for pain in her L buttock pain. Her shoulder pain was getting better by 35% after last session. Pt did have an increased pain in the L arm after moving her arm in the wrong way reaching/ throw her arm up which dissipated after 1 hour. Pt avoided moving her arm. Today this arm no longer hurts and she avoids to lifting it and wanting to her the knots of muscles tensions out of both shoulder.  Pt feels she has gotten weakness in her arm after she moved her arm which felt like "her arm got ripped out of socket".  Denied numbness and tingling. Pt reports she still having sleep issues and she prefers to sleep on her belly.    Pertinent History 7 days in labor and excessive pushing, and one-sided numbness from an epidermal, and her "insides were swelling over her baby's head" which led to an emergency C-section because her baby was no breathing ( 1993),  Pt was not able to walk until 12 hr after her labor due to residual numbness from her epidural.              [facility] PT Assessment - 03/30/16 1817      Coordination   Gross Motor Movements are Fluid and Coordinated --  poor scapular control, deppression coordination delayed     AROM   Overall AROM  --  pain @ BUE abd 90deg, cued for scap depress/cervical retract   Overall AROM Comments seated:  Lthumb behind L1, R thumb T10      Strength   Overall Strength Comments MMT UE 4/5      Hawkins-Kennedy test   Findings Negative     Lift-Off test   Findings Negative     Empty Can test   Findings Negative     Full Can test   Findings Negative       Decreased upper trap and midback mm tensions compared to last session               [facility] Adult PT Treatment/Exercise - 03/30/16 1818      Therapeutic Activites    Therapeutic Activities --  see pt instructions     Neuro Re-ed    Neuro Re-ed Details  scapular motor control training                PT Education - 03/29/16 1633  Education provided Yes   Education Details HEP   Person(s) Educated Patient   Methods Explanation;Demonstration;Tactile cues;Verbal cues;Handout   Comprehension Verbalized understanding;Returned demonstration;Verbal cues required;Tactile cues required             PT Long Term Goals - 03/30/16 1824      PT LONG TERM GOAL #1   Title Pt will decrease her [place] score from  6 %  to < 3 % in order to return to walking after at the end of the work  (11/21: 0%)    Time 12   Period Weeks   Status Achieved     PT LONG TERM GOAL #2   Title Pt will report decreased numbness by 50% in intensity  in L leg within < 1 hr of sitting in order to wathching a movie   Time 12   Period Weeks   Status Achieved     PT LONG TERM GOAL #3   Title Pt will demo decreased scar restrictions over her abdomen in orer to promote mobility of her hips to walk and lift trays at work   Time 12   Period Weeks   Status Achieved     PT LONG TERM GOAL #4   Title Pt will report her L leg no longer  gives out on her across 2 weeks in order to demo improved strength and to decrease risk for falls.   Time 12   Period Weeks   Status Achieved     PT LONG TERM GOAL #5   Title Pt will report sitting for > 1.5  hrs in a car ride with decreased pain by 50% in order to travel before pit stops   Time 12   Period Weeks   Status Achieved     PT LONG TERM GOAL #6   Title Pt will demo full shoulder abduction ROM from ~140 deg to 180 deg  on LUE in order to progress to thoralumbar strengthening to lift trays repeated at work   Time 12   Period Weeks   Status Partially Met     PT LONG TERM GOAL #7   Title Pt will be able to place her L hand behind back at the level of L1 or level in order to don bra    Time 12   Period Weeks   Status Achieved     PT LONG TERM GOAL #8   Title Pt will decrease her quick dash score from 57 pts to < 50pt in order to return to ADLs at home and at work   Time 12   Period Weeks   Status On-going               Plan - 03/30/16 1818    Clinical Impression Statement Pt continues to have no LBP and has not been taking her pain meds for 2 weeks. Despite pt's c/o shoulder weakness, assessment did not show decreased strength and rotator cuff tests were neg. Pt has shown increased ability to reach behind her back above bra strap level which is a significant improvement compared to last session. While pt showed increased UE ROM and decreased mm upper/midback tensions, pt showed poor scapular motor control with functional work tasks.  Suspect pt's shoulder issue will continue to improve with scapular motor control training. PT plans to monitor pt's progress and has not completely r/o capsular injury.   Pt continues to benefit skilled PT.    Rehab Potential Good   PT Frequency  1x / week   PT Duration 12 weeks   PT Treatment/Interventions ADLs/Self Care Home Management;Aquatic Therapy;Functional mobility training;Stair training;Gait training;Neuromuscular  re-education;Patient/family education;Manual techniques;Therapeutic exercise;Therapeutic activities;Balance training;Moist Heat;Passive range of motion;Scar mobilization   Consulted and Agree with Plan of Care Patient      Patient will benefit from skilled therapeutic intervention in order to improve the following deficits and impairments:  Abnormal gait, Increased muscle spasms, Decreased safety awareness, Decreased endurance, Decreased activity tolerance, Pain, Increased fascial restricitons, Difficulty walking, Decreased range of motion, Decreased coordination, Decreased balance, Decreased scar mobility, Hypomobility, Decreased mobility, Decreased strength, Postural dysfunction, Improper body mechanics, Impaired flexibility  Visit Diagnosis: Myalgia  Other abnormalities of gait and mobility  Chronic left shoulder pain     Problem List There are no active problems to display for this patient.   Jerl Mina 03/30/2016, 6:25 PM  [place] MAIN [facility] SERVICES [address] [place], [place], [address] Phone: [phone]   Fax:  [phone]  Name: Rachel Lewis MRN: [id number] Date of Birth: March 02, 1971

## 2016-04-19 ENCOUNTER — Encounter: Payer: BLUE CROSS/BLUE SHIELD | Admitting: Physical Therapy

## 2016-05-03 ENCOUNTER — Ambulatory Visit: Admit: 2016-05-03 | Payer: Self-pay | Admitting: Gastroenterology

## 2016-05-03 SURGERY — COLONOSCOPY WITH PROPOFOL
Anesthesia: General

## 2016-08-09 ENCOUNTER — Other Ambulatory Visit: Payer: Self-pay | Admitting: Obstetrics and Gynecology

## 2016-08-09 DIAGNOSIS — Z1231 Encounter for screening mammogram for malignant neoplasm of breast: Secondary | ICD-10-CM

## 2016-09-04 ENCOUNTER — Ambulatory Visit
Admission: RE | Admit: 2016-09-04 | Discharge: 2016-09-04 | Disposition: A | Discharge status: Home / Self Care | Payer: BLUE CROSS/BLUE SHIELD | Source: Ambulatory Visit | Attending: Obstetrics and Gynecology | Admitting: Obstetrics and Gynecology

## 2016-09-04 DIAGNOSIS — Z1231 Encounter for screening mammogram for malignant neoplasm of breast: Secondary | ICD-10-CM | POA: Diagnosis not present

## 2016-11-29 DIAGNOSIS — M5134 Other intervertebral disc degeneration, thoracic region: Secondary | ICD-10-CM | POA: Insufficient documentation

## 2016-11-29 DIAGNOSIS — G8929 Other chronic pain: Secondary | ICD-10-CM | POA: Insufficient documentation

## 2016-11-29 DIAGNOSIS — F419 Anxiety disorder, unspecified: Secondary | ICD-10-CM | POA: Insufficient documentation

## 2017-09-12 ENCOUNTER — Other Ambulatory Visit: Payer: Self-pay | Admitting: Obstetrics and Gynecology

## 2017-09-12 DIAGNOSIS — Z1231 Encounter for screening mammogram for malignant neoplasm of breast: Secondary | ICD-10-CM

## 2017-10-03 ENCOUNTER — Ambulatory Visit
Admission: RE | Admit: 2017-10-03 | Discharge: 2017-10-03 | Disposition: A | Discharge status: Home / Self Care | Payer: BLUE CROSS/BLUE SHIELD | Source: Ambulatory Visit | Attending: Obstetrics and Gynecology | Admitting: Obstetrics and Gynecology

## 2017-10-03 DIAGNOSIS — Z1231 Encounter for screening mammogram for malignant neoplasm of breast: Secondary | ICD-10-CM

## 2018-10-16 ENCOUNTER — Other Ambulatory Visit: Payer: Self-pay | Admitting: Obstetrics and Gynecology

## 2018-10-16 DIAGNOSIS — Z1231 Encounter for screening mammogram for malignant neoplasm of breast: Secondary | ICD-10-CM

## 2018-10-24 ENCOUNTER — Emergency Department
Admission: EM | Admit: 2018-10-24 | Discharge: 2018-10-24 | Disposition: A | Discharge status: Home / Self Care | Payer: BLUE CROSS/BLUE SHIELD | Attending: Emergency Medicine | Admitting: Emergency Medicine

## 2018-10-24 ENCOUNTER — Other Ambulatory Visit: Payer: Self-pay

## 2018-10-24 ENCOUNTER — Emergency Department: Payer: BLUE CROSS/BLUE SHIELD

## 2018-10-24 ENCOUNTER — Encounter: Payer: Self-pay | Admitting: Emergency Medicine

## 2018-10-24 DIAGNOSIS — Z87891 Personal history of nicotine dependence: Secondary | ICD-10-CM | POA: Insufficient documentation

## 2018-10-24 DIAGNOSIS — R42 Dizziness and giddiness: Secondary | ICD-10-CM | POA: Insufficient documentation

## 2018-10-24 DIAGNOSIS — R51 Headache: Secondary | ICD-10-CM | POA: Diagnosis present

## 2018-10-24 DIAGNOSIS — Z79899 Other long term (current) drug therapy: Secondary | ICD-10-CM | POA: Insufficient documentation

## 2018-10-24 MED ORDER — LORAZEPAM 2 MG/ML IJ SOLN
1.0000 mg | Freq: Once | INTRAMUSCULAR | Status: AC
  Administered 2018-10-24: 1 mg via INTRAVENOUS
  Filled 2018-10-24: qty 1

## 2018-10-24 MED ORDER — LORAZEPAM 2 MG/ML IJ SOLN
1.0000 mg | Freq: Once | INTRAMUSCULAR | Status: AC
  Administered 2018-10-24: 16:00:00 1 mg via INTRAVENOUS
  Filled 2018-10-24: qty 1

## 2018-10-24 MED ORDER — MECLIZINE HCL 25 MG PO TABS
25.0000 mg | ORAL_TABLET | Freq: Once | ORAL | Status: AC
  Administered 2018-10-24: 25 mg via ORAL
  Filled 2018-10-24: qty 1

## 2018-10-24 MED ORDER — MECLIZINE HCL 25 MG PO TABS: 25.0000 mg | ORAL_TABLET | Freq: Three times a day (TID) | ORAL | 0 refills | Status: AC | PRN

## 2018-10-24 NOTE — ED Triage Notes (Signed)
Pt sent over from [facility] with c/o HA since July 4th and dizziness. Pt states she feels like she is drunk walking.

## 2018-10-24 NOTE — Discharge Instructions (Signed)
Please take the meclizine or Antivert 1 pill 3 times a day to help with the vertigo.  Please return if you get any worse at all or develop any other new symptoms.

## 2018-10-24 NOTE — ED Provider Notes (Signed)
[facility] Emergency Department Provider Note   ____________________________________________   First MD Initiated Contact with Patient 10/24/18 1213     (approximate)  I have reviewed the triage vital signs and the nursing notes.   HISTORY  Chief Complaint Dizziness and Headache    HPI Rachel Lewis is a 48 y.o. female who reports a dull achy headache since July 4.  It has moved and is now more localized in the left  occipital area.  For the last 3 days she is also had vertigo.  Is made worse by position change laying down sitting up moving head from side to side.  It waxes and wanes.  She is not having any nausea or vomiting with it.  She is not having any fever.  She is not had any weakness or numbness.  She is not had this previously.  She did have one episode of blurry vision yesterday but that has resolved.        Past Medical History:  Diagnosis Date   Arthritis    back    There are no active problems to display for this patient.   Past Surgical History:  Procedure Laterality Date   ABDOMINAL HYSTERECTOMY     partial    CESAREAN SECTION      Prior to Admission medications   Medication Sig Start Date End Date Taking? Authorizing Provider  estradiol (ESTRACE) 2 MG tablet Take 1 mg by mouth daily. 10/07/17  Yes [provider]  ibuprofen (ADVIL) 200 MG tablet Take 200 mg by mouth every 6 (six) hours as needed.   Yes [provider]  valACYclovir (VALTREX) 1000 MG tablet 2 tabs q 12 hrs  1 day 11/26/17  Yes [provider]  meclizine (ANTIVERT) 25 MG tablet Take 1 tablet (25 mg total) by mouth 3 (three) times daily as needed for dizziness. 10/24/18   Nena Polio, MD    Allergies Amoxicillin-pot clavulanate and Citalopram  Family History  Problem Relation Age of Onset   Breast cancer Neg Hx     Social History Social History   Tobacco Use   Smoking status: Former Smoker    Types: Cigarettes   Quit date: 11/26/2015    Years since quitting: 2.9   Smokeless tobacco: Never Used  Substance Use Topics   Alcohol use: No   Drug use: No    Review of Systems  Constitutional: No fever/chills Eyes: No visual changes. ENT: No sore throat. Cardiovascular: Denies chest pain. Respiratory: Denies shortness of breath. Gastrointestinal: No abdominal pain.  No nausea, no vomiting.  No diarrhea.  No constipation. Genitourinary: Negative for dysuria. Musculoskeletal: Negative for back pain. Skin: Negative for rash. Neurological: See HPI   ____________________________________________   PHYSICAL EXAM:  VITAL SIGNS: ED Triage Vitals  Enc Vitals Group     BP 10/24/18 1145 130/69     Pulse Rate 10/24/18 1145 64     Resp 10/24/18 1145 20     Temp --      Temp src --      SpO2 10/24/18 1145 97 %     Weight 10/24/18 1127 174 lb 14.4 oz (79.3 kg)     Height 10/24/18 1127 5\' 4"  (1.626 m)     Head Circumference --      Peak Flow --      Pain Score 10/24/18 1126 7     Pain Loc --      Pain Edu? --  Excl. in [place]? --     Constitutional: Alert and oriented. Well appearing and in no acute distress. Eyes: Conjunctivae are normal. PERRL. EOMI. Head: Atraumatic. Nose: No congestion/rhinnorhea. Mouth/Throat: Mucous membranes are moist.  Oropharynx non-erythematous. Neck: No stridor.   Cardiovascular: Normal rate, regular rhythm. Grossly normal heart sounds.  Good peripheral circulation. Respiratory: Normal respiratory effort.  No retractions. Lungs CTAB. Gastrointestinal: Soft and nontender. No distention. No abdominal bruits. No CVA tenderness. Musculoskeletal: No lower extremity tenderness nor edema.   Neurologic:  Normal speech and language. No gross focal neurologic deficits are appreciated.  Cranial nerves II through XII are intact although visual fields were not checked.  Cerebellar finger-nose rapid alternating movements in the hands and heel-to-shin are all normal.  Motor  strength is 5/5 throughout.  Patient does not report any numbness.  Patient does have nystagmus on head movement and head movement does make the vertigo worse.  Thrust testing is indeterminate.  The vertigo makes her close her eyes. Skin:  Skin is warm, dry and intact. No rash noted. Psychiatric: Mood and affect are normal. Speech and behavior are normal.  ____________________________________________   LABS (all labs ordered are listed, but only abnormal results are displayed)  Labs Reviewed - No data to display ____________________________________________  EKG   ____________________________________________  RADIOLOGY  ED MD interpretation: MRI read by radiology reviewed by me shows a number of small diffuse T2 flares.  This could be consistent with a number of different problems but nothing acute.  Discussed with Dr. Thad Rangereynolds neurology.  She feels this will need an outpatient work-up.  Official radiology report(s): Mr Brain Wo Contrast  Result Date: 10/24/2018 CLINICAL DATA:  Left-sided headache and dizziness. EXAM: MRI HEAD WITHOUT CONTRAST TECHNIQUE: Multiplanar, multiecho pulse sequences of the brain and surrounding structures were obtained without intravenous contrast. COMPARISON:  None. FINDINGS: Brain: There is no evidence of acute infarct, intracranial hemorrhage, mass, midline shift, or extra-axial fluid collection. The ventricles and sulci are normal. There are fewer than 10 small foci of T2/FLAIR hyperintensity in the cerebral white matter, mildly advanced for age. 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 Other major intracranial vascular flow voids are preserved. Skull and upper cervical spine: Unremarkable bone marrow signal. Sinuses/Orbits: Unremarkable orbits. Paranasal sinuses and mastoid air cells are clear. Other: None. IMPRESSION: 1. No acute intracranial abnormality. 2. Mild cerebral white matter T2 signal  changes, nonspecific though may reflect early chronic small vessel ischemia, migraines, prior infection/inflammation, or demyelination. Electronically Signed   By: Sebastian AcheAllen  Grady M.D.   On: 10/24/2018 15:00    ____________________________________________   PROCEDURES  Procedure(s) performed (including Critical Care):  Procedures   ____________________________________________   INITIAL IMPRESSION / ASSESSMENT AND PLAN / ED COURSE  Buel Reamngela A Andreatta was evaluated in Emergency Department on 10/24/2018 for the symptoms described in the history of present illness. She was evaluated in the context of the global COVID-19 pandemic, which necessitated consideration that the patient might be at risk for infection with the SARS-CoV-2 virus that causes COVID-19. Institutional protocols and algorithms that pertain to the evaluation of patients at risk for COVID-19 are in a state of rapid change based on information released by regulatory bodies including the CDC and federal and state organizations. These policies and algorithms were followed during the patient's care in the ED.             ____________________________________________   FINAL CLINICAL IMPRESSION(S) / ED DIAGNOSES  Final diagnoses:  Vertigo     ED Discharge Orders         Ordered    meclizine (ANTIVERT) 25 MG tablet  3 times daily PRN     10/24/18 1738           Note:  This document was prepared using Dragon voice recognition software and may include unintentional dictation errors.   Arnaldo NatalMalinda, Lafe Clerk F, MD 10/24/18 1739

## 2018-10-24 NOTE — ED Notes (Signed)
Lavender, light green and blue tubes sent to lab 

## 2018-10-24 NOTE — ED Notes (Signed)
Dr. Cinda Quest informed pt still c/o dizziness

## 2018-10-24 NOTE — ED Notes (Addendum)
Pt ambulated in hall with unsteady gait, pt has to grasp onto the wall for support at times. Pt states she gets dizzy everytime she turns her eyes towards me. Dr. Cinda Quest informed

## 2018-10-24 NOTE — ED Notes (Signed)
Pt otf for MRI 

## 2018-10-24 NOTE — ED Notes (Addendum)
Pt laying in bed speaking with this RN in NAD, A&Ox4, speech is clear. Reports headache since July 4th with dizziness that started Wednesday. A&Ox4. Reports pain 6/10 on the left side of her head.

## 2018-10-24 NOTE — ED Notes (Signed)
Pt ambulating in the hall when I came to the room, pt refuses to use the toilet in her room and walked to the bathroom in the hall, gait steady, however given chief complaint advised to use toilet in room or wait for assistance next time

## 2018-11-25 ENCOUNTER — Other Ambulatory Visit: Payer: Self-pay

## 2018-11-25 ENCOUNTER — Ambulatory Visit
Admission: RE | Admit: 2018-11-25 | Discharge: 2018-11-25 | Disposition: A | Discharge status: Home / Self Care | Payer: BLUE CROSS/BLUE SHIELD | Source: Ambulatory Visit | Attending: Obstetrics and Gynecology | Admitting: Obstetrics and Gynecology

## 2018-11-25 DIAGNOSIS — Z1231 Encounter for screening mammogram for malignant neoplasm of breast: Secondary | ICD-10-CM | POA: Insufficient documentation

## 2019-10-30 ENCOUNTER — Other Ambulatory Visit: Payer: Self-pay | Admitting: Internal Medicine

## 2019-10-30 DIAGNOSIS — Z1231 Encounter for screening mammogram for malignant neoplasm of breast: Secondary | ICD-10-CM

## 2019-11-26 ENCOUNTER — Ambulatory Visit
Admission: RE | Admit: 2019-11-26 | Discharge: 2019-11-26 | Disposition: A | Discharge status: Home / Self Care | Payer: 59 | Source: Ambulatory Visit | Attending: Internal Medicine | Admitting: Internal Medicine

## 2019-11-26 ENCOUNTER — Other Ambulatory Visit: Payer: Self-pay

## 2019-11-26 DIAGNOSIS — Z1231 Encounter for screening mammogram for malignant neoplasm of breast: Secondary | ICD-10-CM | POA: Diagnosis not present

## 2020-01-03 IMAGING — MG DIGITAL SCREENING BILATERAL MAMMOGRAM WITH TOMO AND CAD
8 series · 8 of 24 positions shown · non-contrast
Comparison: Previous exam(s).

CLINICAL DATA: Screening.

EXAM:
DIGITAL SCREENING BILATERAL MAMMOGRAM WITH TOMO AND CAD

[L CC synth-2D]
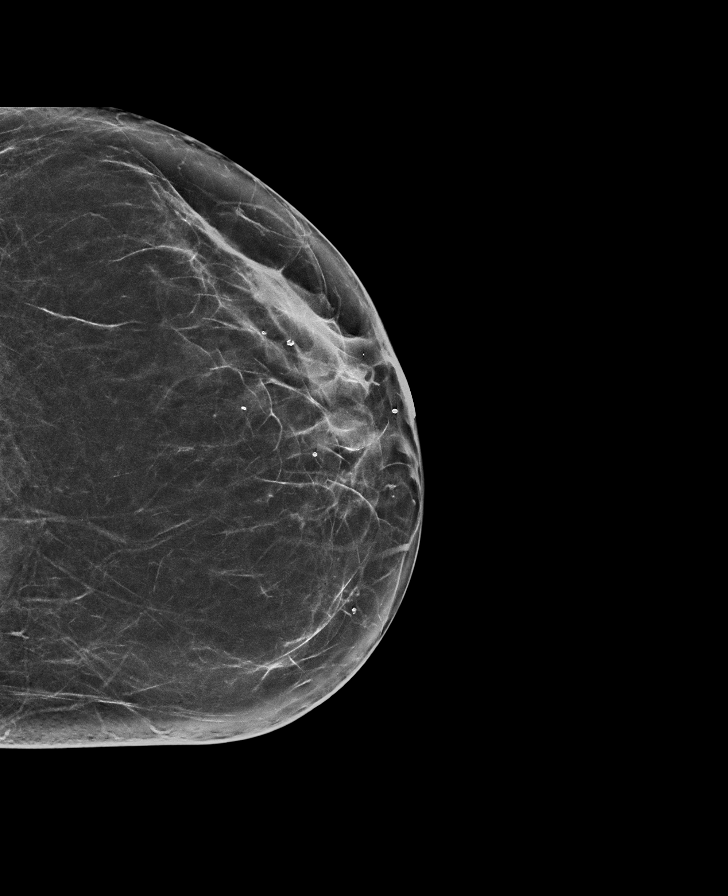

[R CC synth-2D]
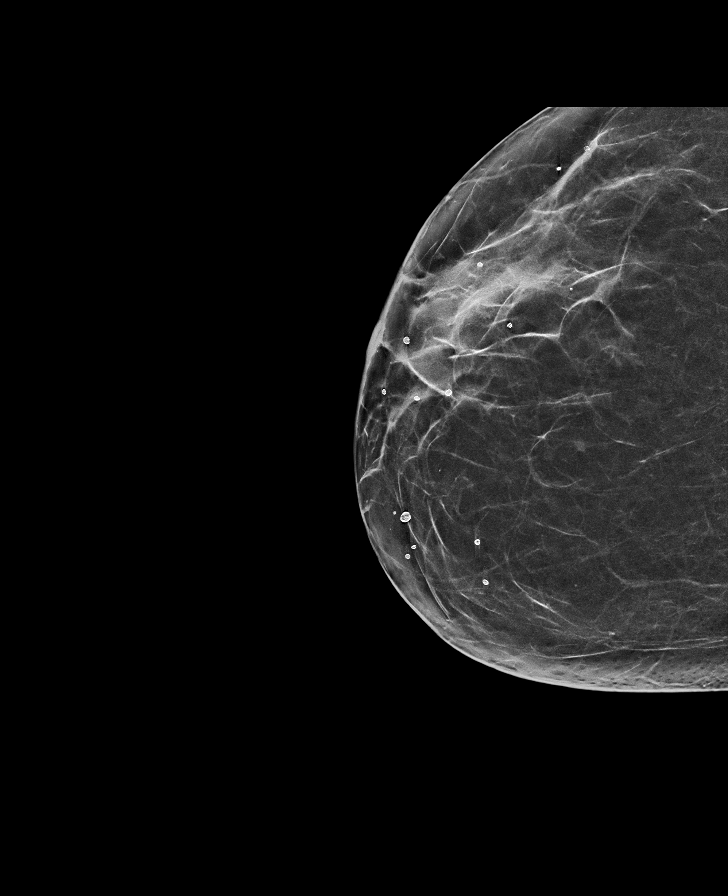

[R MLO synth-2D]
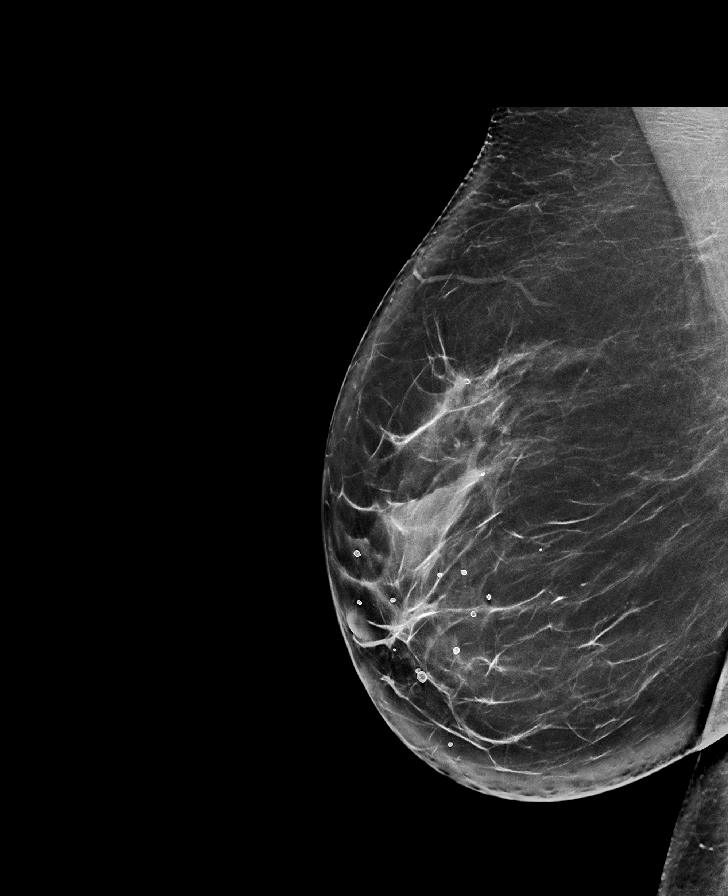

[L MLO synth-2D]
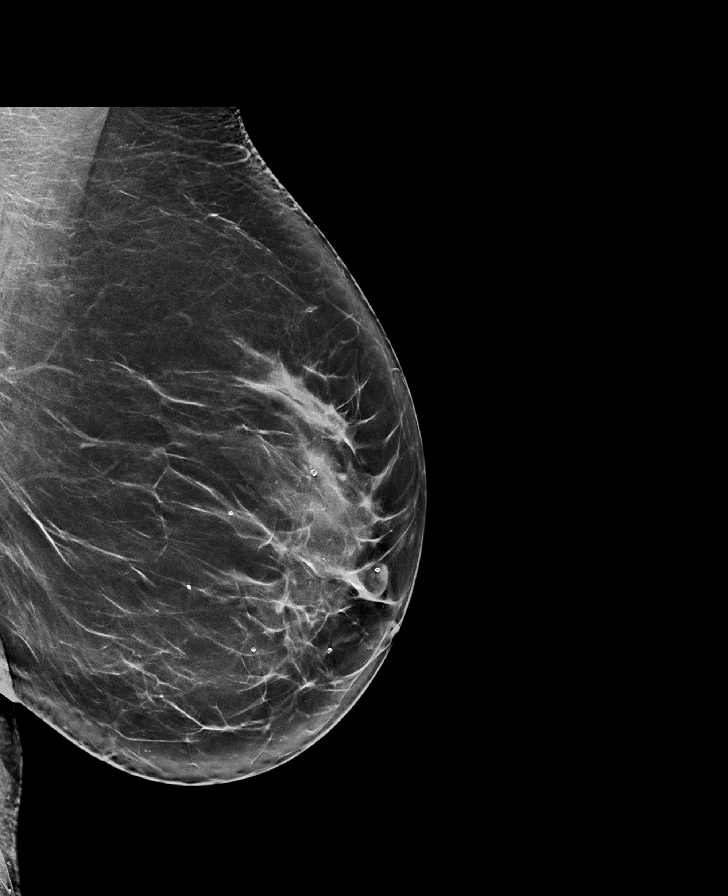

[R MLO tomo · tomo slice 43/85.0]
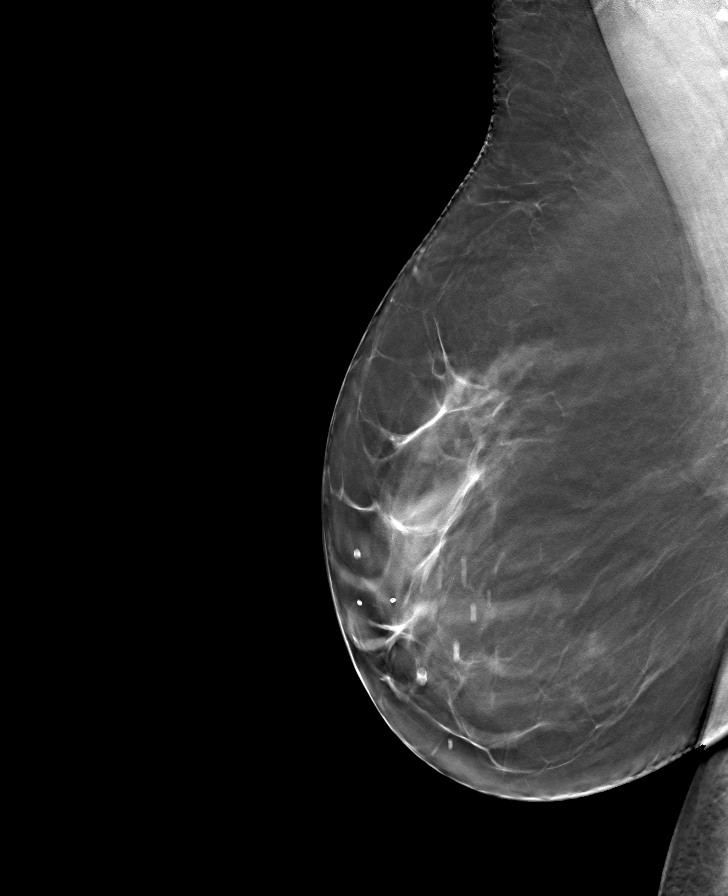

[L CC tomo · tomo slice 37/73.0]
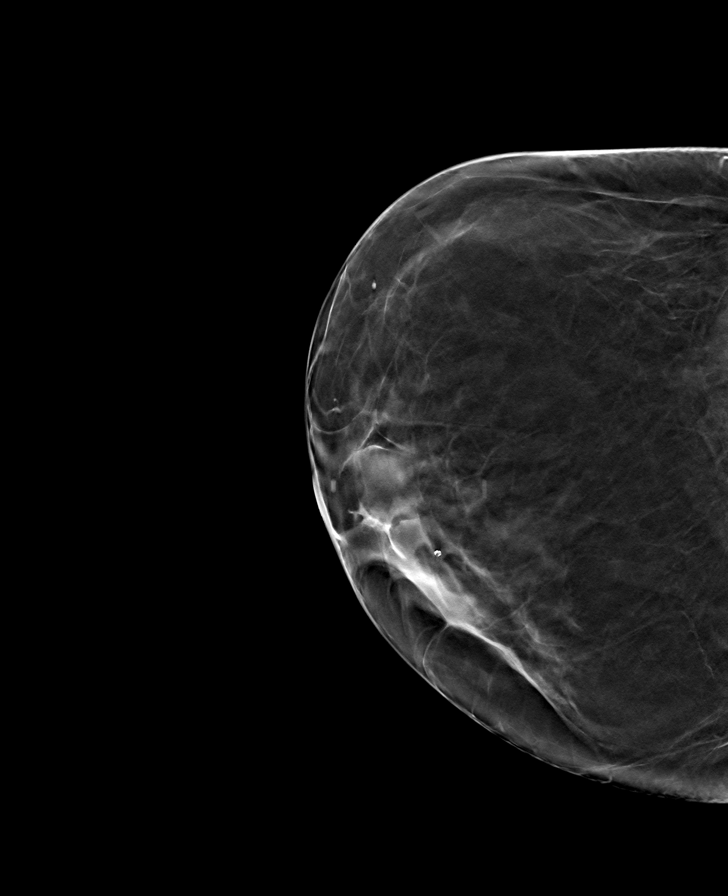

[L MLO tomo · tomo slice 43/85.0]
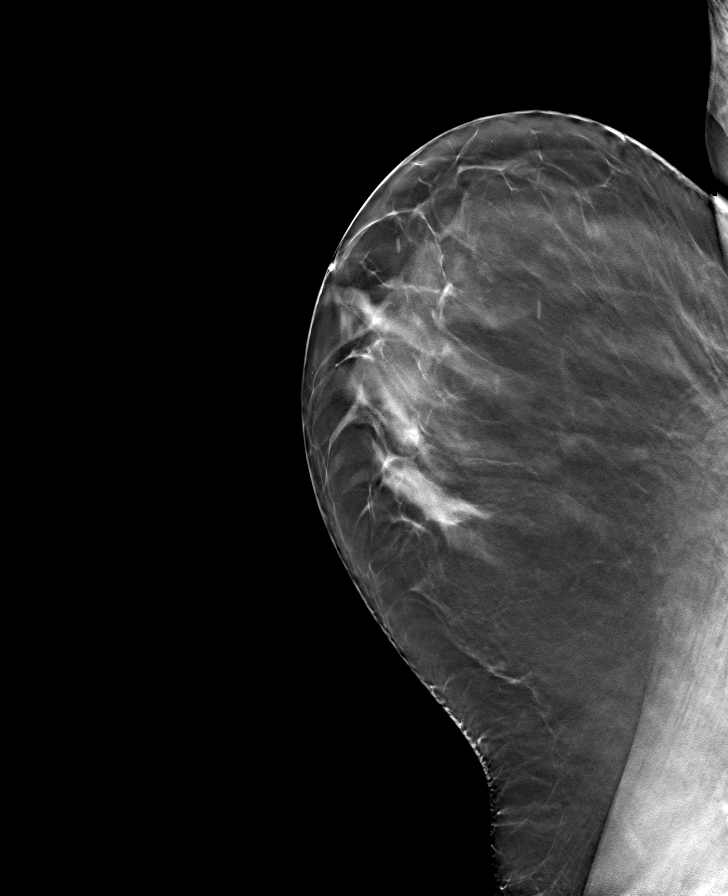

[R CC tomo · tomo slice 35/70.0]
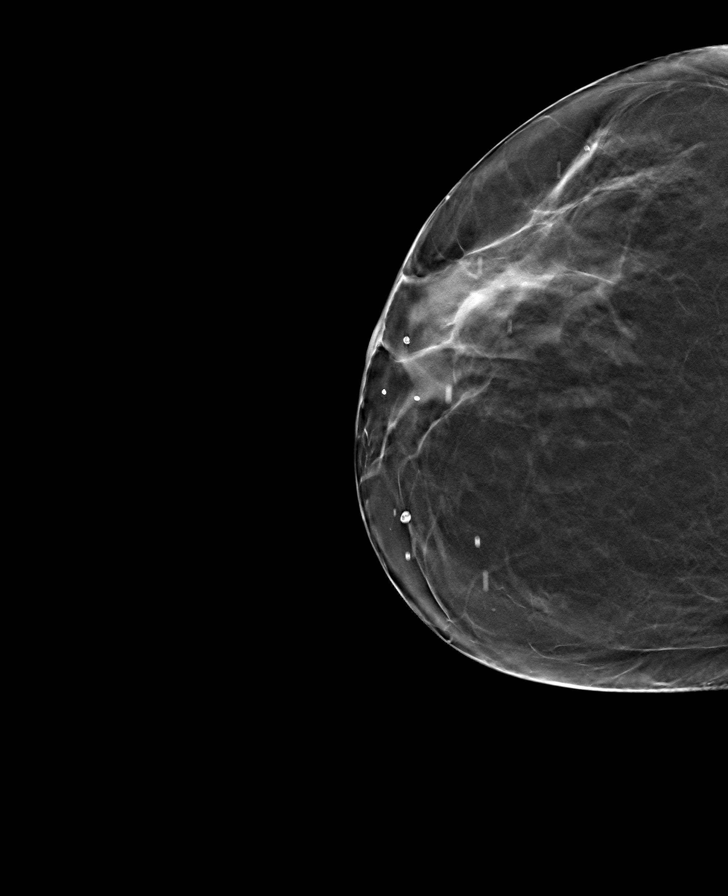

[8 of 24 positions shown; findings below may reference images not displayed]

ACR Breast Density Category c: The breast tissue is heterogeneously
dense, which may obscure small masses.
FINDINGS: There are no findings suspicious for malignancy. Images were
processed with CAD.
IMPRESSION: No mammographic evidence of malignancy. A result letter of this
screening mammogram will be mailed directly to the patient.

RECOMMENDATION:
Screening mammogram in one year. (Code:[id number]-U-LHB)

BI-RADS CATEGORY  1: Negative.

## 2020-10-25 ENCOUNTER — Other Ambulatory Visit: Payer: Self-pay | Admitting: Obstetrics and Gynecology

## 2020-10-25 DIAGNOSIS — Z1231 Encounter for screening mammogram for malignant neoplasm of breast: Secondary | ICD-10-CM

## 2020-11-29 ENCOUNTER — Other Ambulatory Visit: Payer: Self-pay

## 2020-11-29 ENCOUNTER — Ambulatory Visit
Admission: RE | Admit: 2020-11-29 | Discharge: 2020-11-29 | Disposition: A | Discharge status: Home / Self Care | Payer: 59 | Source: Ambulatory Visit | Attending: Obstetrics and Gynecology | Admitting: Obstetrics and Gynecology

## 2020-11-29 DIAGNOSIS — Z1231 Encounter for screening mammogram for malignant neoplasm of breast: Secondary | ICD-10-CM | POA: Insufficient documentation

## 2020-12-06 ENCOUNTER — Other Ambulatory Visit: Payer: Self-pay | Admitting: Obstetrics and Gynecology

## 2020-12-06 DIAGNOSIS — N6489 Other specified disorders of breast: Secondary | ICD-10-CM

## 2020-12-06 DIAGNOSIS — R928 Other abnormal and inconclusive findings on diagnostic imaging of breast: Secondary | ICD-10-CM

## 2020-12-15 ENCOUNTER — Other Ambulatory Visit: Payer: Self-pay

## 2020-12-15 ENCOUNTER — Ambulatory Visit
Admission: RE | Admit: 2020-12-15 | Discharge: 2020-12-15 | Disposition: A | Discharge status: Home / Self Care | Payer: 59 | Source: Ambulatory Visit | Attending: Obstetrics and Gynecology | Admitting: Obstetrics and Gynecology

## 2020-12-15 DIAGNOSIS — R928 Other abnormal and inconclusive findings on diagnostic imaging of breast: Secondary | ICD-10-CM

## 2020-12-15 DIAGNOSIS — N6489 Other specified disorders of breast: Secondary | ICD-10-CM | POA: Insufficient documentation

## 2021-06-20 DIAGNOSIS — Z01419 Encounter for gynecological examination (general) (routine) without abnormal findings: Secondary | ICD-10-CM | POA: Diagnosis not present

## 2021-06-20 DIAGNOSIS — N898 Other specified noninflammatory disorders of vagina: Secondary | ICD-10-CM | POA: Diagnosis not present

## 2021-06-20 DIAGNOSIS — Z1239 Encounter for other screening for malignant neoplasm of breast: Secondary | ICD-10-CM | POA: Diagnosis not present

## 2021-09-05 DIAGNOSIS — R053 Chronic cough: Secondary | ICD-10-CM | POA: Diagnosis not present

## 2021-09-05 DIAGNOSIS — R69 Illness, unspecified: Secondary | ICD-10-CM | POA: Diagnosis not present

## 2021-09-05 DIAGNOSIS — M549 Dorsalgia, unspecified: Secondary | ICD-10-CM | POA: Diagnosis not present

## 2021-09-05 DIAGNOSIS — J3089 Other allergic rhinitis: Secondary | ICD-10-CM | POA: Diagnosis not present

## 2021-09-05 DIAGNOSIS — R232 Flushing: Secondary | ICD-10-CM | POA: Diagnosis not present

## 2021-09-05 DIAGNOSIS — Z0000 Encounter for general adult medical examination without abnormal findings: Secondary | ICD-10-CM | POA: Diagnosis not present

## 2021-09-05 DIAGNOSIS — G8929 Other chronic pain: Secondary | ICD-10-CM | POA: Diagnosis not present

## 2021-09-05 DIAGNOSIS — R42 Dizziness and giddiness: Secondary | ICD-10-CM | POA: Diagnosis not present

## 2021-12-03 DIAGNOSIS — J029 Acute pharyngitis, unspecified: Secondary | ICD-10-CM | POA: Diagnosis not present

## 2021-12-03 DIAGNOSIS — Z03818 Encounter for observation for suspected exposure to other biological agents ruled out: Secondary | ICD-10-CM | POA: Diagnosis not present

## 2021-12-03 DIAGNOSIS — R051 Acute cough: Secondary | ICD-10-CM | POA: Diagnosis not present

## 2021-12-03 DIAGNOSIS — J209 Acute bronchitis, unspecified: Secondary | ICD-10-CM | POA: Diagnosis not present

## 2021-12-03 DIAGNOSIS — M791 Myalgia, unspecified site: Secondary | ICD-10-CM | POA: Diagnosis not present

## 2021-12-18 DIAGNOSIS — M5442 Lumbago with sciatica, left side: Secondary | ICD-10-CM | POA: Diagnosis not present

## 2021-12-18 DIAGNOSIS — M25551 Pain in right hip: Secondary | ICD-10-CM | POA: Diagnosis not present

## 2021-12-18 DIAGNOSIS — M25552 Pain in left hip: Secondary | ICD-10-CM | POA: Diagnosis not present

## 2021-12-18 DIAGNOSIS — M5441 Lumbago with sciatica, right side: Secondary | ICD-10-CM | POA: Diagnosis not present

## 2022-04-17 DIAGNOSIS — Z0000 Encounter for general adult medical examination without abnormal findings: Secondary | ICD-10-CM | POA: Diagnosis not present

## 2022-04-17 DIAGNOSIS — R8290 Unspecified abnormal findings in urine: Secondary | ICD-10-CM | POA: Diagnosis not present

## 2022-04-17 DIAGNOSIS — J301 Allergic rhinitis due to pollen: Secondary | ICD-10-CM | POA: Diagnosis not present

## 2022-04-17 DIAGNOSIS — Z720 Tobacco use: Secondary | ICD-10-CM | POA: Diagnosis not present

## 2022-04-17 DIAGNOSIS — F419 Anxiety disorder, unspecified: Secondary | ICD-10-CM | POA: Diagnosis not present

## 2022-04-17 DIAGNOSIS — R232 Flushing: Secondary | ICD-10-CM | POA: Diagnosis not present

## 2022-04-17 DIAGNOSIS — R7309 Other abnormal glucose: Secondary | ICD-10-CM | POA: Diagnosis not present

## 2022-04-24 DIAGNOSIS — F419 Anxiety disorder, unspecified: Secondary | ICD-10-CM | POA: Diagnosis not present

## 2022-04-24 DIAGNOSIS — Z23 Encounter for immunization: Secondary | ICD-10-CM | POA: Diagnosis not present

## 2022-04-24 DIAGNOSIS — E538 Deficiency of other specified B group vitamins: Secondary | ICD-10-CM | POA: Diagnosis not present

## 2022-04-24 DIAGNOSIS — R5383 Other fatigue: Secondary | ICD-10-CM | POA: Diagnosis not present

## 2022-04-24 DIAGNOSIS — R7989 Other specified abnormal findings of blood chemistry: Secondary | ICD-10-CM | POA: Diagnosis not present

## 2022-04-24 DIAGNOSIS — R5381 Other malaise: Secondary | ICD-10-CM | POA: Diagnosis not present

## 2022-04-24 DIAGNOSIS — Z720 Tobacco use: Secondary | ICD-10-CM | POA: Diagnosis not present

## 2022-04-24 DIAGNOSIS — R7309 Other abnormal glucose: Secondary | ICD-10-CM | POA: Diagnosis not present

## 2022-04-24 DIAGNOSIS — M25562 Pain in left knee: Secondary | ICD-10-CM | POA: Diagnosis not present

## 2022-05-07 ENCOUNTER — Other Ambulatory Visit: Payer: Self-pay

## 2022-05-07 ENCOUNTER — Emergency Department
Admission: EM | Admit: 2022-05-07 | Discharge: 2022-05-07 | Disposition: A | Discharge status: Home / Self Care | Payer: 59 | Attending: Emergency Medicine | Admitting: Emergency Medicine

## 2022-05-07 ENCOUNTER — Encounter: Payer: Self-pay | Admitting: Emergency Medicine

## 2022-05-07 DIAGNOSIS — Z23 Encounter for immunization: Secondary | ICD-10-CM | POA: Insufficient documentation

## 2022-05-07 DIAGNOSIS — S0502XA Injury of conjunctiva and corneal abrasion without foreign body, left eye, initial encounter: Secondary | ICD-10-CM | POA: Insufficient documentation

## 2022-05-07 DIAGNOSIS — X58XXXA Exposure to other specified factors, initial encounter: Secondary | ICD-10-CM | POA: Insufficient documentation

## 2022-05-07 MED ORDER — TETANUS-DIPHTH-ACELL PERTUSSIS 5-2.5-18.5 LF-MCG/0.5 IM SUSY
0.5000 mL | PREFILLED_SYRINGE | Freq: Once | INTRAMUSCULAR | Status: AC
  Administered 2022-05-07: 0.5 mL via INTRAMUSCULAR
  Filled 2022-05-07: qty 0.5

## 2022-05-07 MED ORDER — ERYTHROMYCIN 5 MG/GM OP OINT: 1.0000 | TOPICAL_OINTMENT | Freq: Four times a day (QID) | OPHTHALMIC | 0 refills | Status: AC

## 2022-05-07 MED ORDER — KETOROLAC TROMETHAMINE 0.5 % OP SOLN: 1.0000 [drp] | Freq: Four times a day (QID) | OPHTHALMIC | 0 refills | Status: DC

## 2022-05-07 MED ORDER — FLUORESCEIN SODIUM 1 MG OP STRP
1.0000 | ORAL_STRIP | Freq: Once | OPHTHALMIC | Status: AC
  Administered 2022-05-07: 1 via OPHTHALMIC
  Filled 2022-05-07: qty 1

## 2022-05-07 MED ORDER — TETRACAINE HCL 0.5 % OP SOLN
2.0000 [drp] | Freq: Once | OPHTHALMIC | Status: AC
  Administered 2022-05-07: 2 [drp] via OPHTHALMIC
  Filled 2022-05-07: qty 4
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
## 2022-05-07 NOTE — ED Triage Notes (Signed)
Presents from [facility] with scratch to left eye  States this happened last pm   Vision is blurred  some swelling noted

## 2022-05-10 DIAGNOSIS — S0502XD Injury of conjunctiva and corneal abrasion without foreign body, left eye, subsequent encounter: Secondary | ICD-10-CM | POA: Diagnosis not present

## 2022-10-30 ENCOUNTER — Other Ambulatory Visit: Payer: Self-pay | Admitting: Internal Medicine

## 2022-10-30 DIAGNOSIS — Z1231 Encounter for screening mammogram for malignant neoplasm of breast: Secondary | ICD-10-CM

## 2022-11-08 ENCOUNTER — Ambulatory Visit
Admission: RE | Admit: 2022-11-08 | Discharge: 2022-11-08 | Disposition: A | Discharge status: Home / Self Care | Payer: 59 | Source: Ambulatory Visit | Attending: Internal Medicine | Admitting: Internal Medicine

## 2022-11-08 DIAGNOSIS — Z1231 Encounter for screening mammogram for malignant neoplasm of breast: Secondary | ICD-10-CM | POA: Diagnosis present

## 2023-02-06 ENCOUNTER — Encounter: Payer: Self-pay | Admitting: Gastroenterology

## 2023-03-29 ENCOUNTER — Encounter: Payer: Self-pay | Admitting: Gastroenterology

## 2023-04-22 ENCOUNTER — Ambulatory Visit: Admission: RE | Admit: 2023-04-22 | Payer: Self-pay | Source: Home / Self Care | Admitting: Gastroenterology

## 2023-04-22 SURGERY — COLONOSCOPY WITH PROPOFOL
Anesthesia: General

## 2023-08-13 ENCOUNTER — Telehealth: Payer: Self-pay | Admitting: Nurse Practitioner

## 2023-08-13 ENCOUNTER — Ambulatory Visit: Admitting: Nurse Practitioner

## 2023-08-13 ENCOUNTER — Encounter: Payer: Self-pay | Admitting: Nurse Practitioner

## 2023-08-13 VITALS — BP 126/82 | HR 59 | Temp 97.7°F | Ht 63.0 in | Wt 179.2 lb

## 2023-08-13 DIAGNOSIS — Z1159 Encounter for screening for other viral diseases: Secondary | ICD-10-CM | POA: Diagnosis not present

## 2023-08-13 DIAGNOSIS — Z87891 Personal history of nicotine dependence: Secondary | ICD-10-CM

## 2023-08-13 DIAGNOSIS — R7303 Prediabetes: Secondary | ICD-10-CM

## 2023-08-13 DIAGNOSIS — Z1211 Encounter for screening for malignant neoplasm of colon: Secondary | ICD-10-CM

## 2023-08-13 DIAGNOSIS — Z13820 Encounter for screening for osteoporosis: Secondary | ICD-10-CM

## 2023-08-13 DIAGNOSIS — Z0000 Encounter for general adult medical examination without abnormal findings: Secondary | ICD-10-CM | POA: Diagnosis not present

## 2023-08-13 DIAGNOSIS — K219 Gastro-esophageal reflux disease without esophagitis: Secondary | ICD-10-CM

## 2023-08-13 DIAGNOSIS — Z13220 Encounter for screening for lipoid disorders: Secondary | ICD-10-CM | POA: Diagnosis not present

## 2023-08-13 DIAGNOSIS — Z114 Encounter for screening for human immunodeficiency virus [HIV]: Secondary | ICD-10-CM | POA: Diagnosis not present

## 2023-08-13 DIAGNOSIS — E559 Vitamin D deficiency, unspecified: Secondary | ICD-10-CM | POA: Diagnosis not present

## 2023-08-13 DIAGNOSIS — F419 Anxiety disorder, unspecified: Secondary | ICD-10-CM

## 2023-08-13 DIAGNOSIS — Z1231 Encounter for screening mammogram for malignant neoplasm of breast: Secondary | ICD-10-CM

## 2023-08-13 LAB — COMPREHENSIVE METABOLIC PANEL WITH GFR
ALT: 14 U/L (ref 0–35)
AST: 15 U/L (ref 0–37)
Albumin: 4.3 g/dL (ref 3.5–5.2)
Alkaline Phosphatase: 69 U/L (ref 39–117)
BUN: 19 mg/dL (ref 6–23)
CO2: 29 meq/L (ref 19–32)
Calcium: 9 mg/dL (ref 8.4–10.5)
Chloride: 104 meq/L (ref 96–112)
Creatinine, Ser: 0.83 mg/dL (ref 0.40–1.20)
GFR: 80.43 mL/min (ref >60.00)
Glucose, Bld: 84 mg/dL (ref 70–99)
Potassium: 4.5 meq/L (ref 3.5–5.1)
Sodium: 139 meq/L (ref 135–145)
Total Bilirubin: 0.4 mg/dL (ref 0.2–1.2)
Total Protein: 6.4 g/dL (ref 6.0–8.3)

## 2023-08-13 LAB — CBC
HCT: 38.4 % (ref 36.0–46.0)
Hemoglobin: 13 g/dL (ref 12.0–15.0)
MCHC: 33.9 g/dL (ref 30.0–36.0)
MCV: 89.4 fl (ref 78.0–100.0)
Platelets: 336 10*3/uL (ref 150.0–400.0)
RBC: 4.3 Mil/uL (ref 3.87–5.11)
RDW: 13 % (ref 11.5–15.5)
WBC: 5.4 10*3/uL (ref 4.0–10.5)

## 2023-08-13 LAB — LIPID PANEL
Cholesterol: 191 mg/dL (ref 0–200)
HDL: 49.6 mg/dL (ref >39.00)
LDL Cholesterol: 119 mg/dL — ABNORMAL HIGH (ref 0–99)
NonHDL: 141.5
Total CHOL/HDL Ratio: 4
Triglycerides: 111 mg/dL (ref 0.0–149.0)
VLDL: 22.2 mg/dL (ref 0.0–40.0)

## 2023-08-13 LAB — HEMOGLOBIN A1C: Hgb A1c MFr Bld: 5.9 % (ref 4.6–6.5)

## 2023-08-13 LAB — URINALYSIS, MICROSCOPIC ONLY

## 2023-08-13 LAB — VITAMIN D 25 HYDROXY (VIT D DEFICIENCY, FRACTURES): VITD: 35.35 ng/mL (ref 30.00–100.00)

## 2023-08-13 LAB — TSH: TSH: 1.34 u[IU]/mL (ref 0.35–5.50)

## 2023-08-13 MED ORDER — PANTOPRAZOLE SODIUM 40 MG PO TBEC: 40.0000 mg | DELAYED_RELEASE_TABLET | Freq: Every day | ORAL | 0 refills | Status: DC

## 2023-08-13 MED ORDER — ALPRAZOLAM 0.25 MG PO TBDP: 0.2500 mg | ORAL_TABLET | Freq: Every evening | ORAL | 0 refills | Status: DC | PRN

## 2023-08-13 MED ORDER — IBUPROFEN 200 MG PO TABS: 200.0000 mg | ORAL_TABLET | Freq: Four times a day (QID) | ORAL | 0 refills | Status: DC | PRN

## 2023-08-13 NOTE — Assessment & Plan Note (Signed)
 Discussed age-appropriate immunizations and screening exams.  Did review patient's personal, surgical, social, family histories.  Patient is up-to-date with all age-appropriate vaccinations she would like.  Patient deferred shingles vaccine in office today.  Order placed for CRC screening.  Order placed for mammogram for breast cancer screening and DEXA scan for osteoporosis screening.  Patient was given information at discharge about preventative healthcare maintenance with anticipatory guidance.

## 2023-08-13 NOTE — Assessment & Plan Note (Signed)
History of the same pending A1c today. ?

## 2023-08-13 NOTE — Telephone Encounter (Signed)
 Left voicemail for patient to call the office back.

## 2023-08-13 NOTE — Assessment & Plan Note (Signed)
 History of the same.  Patient denies HI/SI/AVH.  Patient uses alprazolam 0.25 mg nightly as needed.  90 tablets lasts for a year.  Patient had only refilled 30 tablets over this past year refill provided for patient

## 2023-08-13 NOTE — Assessment & Plan Note (Signed)
 Patient has tried esomeprazole 20 and 40 mg for approximately 1 month.  Will switch patient to Protonix 40 mg daily for 30 days to see if this helps with symptomology

## 2023-08-13 NOTE — Assessment & Plan Note (Signed)
 History of the same was repleted with prescription version vitamin D patient requesting refill pending vitamin D level prior to refilling vitamin D

## 2023-08-13 NOTE — Assessment & Plan Note (Signed)
 Pending urine microscopy rule out microscopic hematuria

## 2023-08-13 NOTE — Progress Notes (Signed)
 New Patient Office Visit  Subjective    Patient ID: Rachel Lewis, female    DOB: [birth year]  Age: 53 y.o. MRN: [id number]  CC:  Chief Complaint  Patient presents with   Establish Care    General Check up    Medication Refill    Niravam, vitamin D, ibuprofen     HPI Rachel Lewis presents to establish care   for complete physical and follow up of chronic conditions.  Anxiety: Alprazolam 0.25 every day prn. States that she use it infrequently.  States she has 90 days worth of medication that lasts an entire year  Vertigo: she has meclizine  25mg  TID and she will use it infrequently. States that she will use it if she has an episode coming on   Constipation: has used ducolax and linzess. States that she will use it as needed  GERD: has tried nexium up to 40mg  and has been on it for a month.  Without good relief.  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 2 meals a day and she does snacks. She iwll do chips, dip, and peanuts. She will drink 1 coffee, 2 10 ounce coke and then water  Exercise: No regular exercise. 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 Needs order Lung Cancer Screening: Does not qualify  Pap smear: seen on 09/27/22 at [facility] that she trie deffexor without relief she had doen black cohosh for a year.  States that she started Estroven.  States has been approximately 1 week has not had results as of yet.  Patient was unable to pick up medication prescribed by GYN as it was too expensive  Mammogram: 11/08/2022 that was negative, order placed  Dexa: needs updating, order placed  Sleep: she tries to go t bed 1230  and get up 630. She feels normal for her      Outpatient Encounter Medications as of 08/13/2023  Medication Sig   erythromycin  ophthalmic ointment Place 1 Application into the left eye 4 (four) times daily.   meclizine  (ANTIVERT ) 25 MG tablet Take 1 tablet (25 mg total) by mouth 3 (three) times daily as needed for dizziness.   Na Sulfate-K Sulfate-Mg Sulfate concentrate (SUPREP) 17.5-3.13-1.6 GM/177ML SOLN Take 1 kit by mouth.   pantoprazole (PROTONIX) 40 MG tablet Take 1 tablet (40 mg total) by mouth daily.   valACYclovir (VALTREX) 1000 MG tablet 2 tabs q 12 hrs  1 day   Vitamin D, Ergocalciferol, (DRISDOL) 1.25 MG (50000 UNIT) CAPS capsule Take 50,000 Units by mouth once a week.   [DISCONTINUED] ALPRAZolam (NIRAVAM) 0.25 MG dissolvable tablet Take 0.25 mg by mouth at bedtime as needed for anxiety.   [DISCONTINUED] ibuprofen (ADVIL) 200 MG tablet Take 200 mg by mouth every 6 (six) hours as needed.   ALPRAZolam (NIRAVAM) 0.25 MG dissolvable tablet Take 1 tablet (0.25 mg total) by mouth at bedtime as needed for anxiety.   ibuprofen (ADVIL) 200 MG tablet Take 1 tablet (200 mg total) by mouth every 6 (six) hours as needed.   ketorolac  (ACULAR ) 0.5 % ophthalmic solution Place 1 drop into the left eye 4 (four) times daily.   [DISCONTINUED] estradiol (ESTRACE)  2 MG tablet Take 1 mg by mouth daily.   No facility-administered encounter medications on file as of 08/13/2023.    Past Medical History:  Diagnosis Date   Anxiety    Arthritis    back    Past Surgical History:  Procedure Laterality Date   ABDOMINAL HYSTERECTOMY     partial    CESAREAN SECTION      Family History  Problem Relation Age of Onset   Hearing loss Mother    COPD Father    Breast cancer Neg Hx     Social History   Socioeconomic History   Marital status: Single    Spouse name: Not on file   Number of children: Not on file   Years of education: Not on file   Highest education level: 10th grade  Occupational  History   Not on file  Tobacco Use   Smoking status: Former    Current packs/day: 0.00    Types: Cigarettes    Quit date: 11/26/2015    Years since quitting: 7.7   Smokeless tobacco: Never  Vaping Use   Vaping status: Never Used  Substance and Sexual Activity   Alcohol use: No   Drug use: No   Sexual activity: Not Currently  Other Topics Concern   Not on file  Social History Narrative   Not on file   Social Drivers of Health   Financial Resource Strain: Medium Risk (08/12/2023)   Overall Financial Resource Strain (CARDIA)    Difficulty of Paying Living Expenses: Somewhat hard  Food Insecurity: Food Insecurity Present (08/12/2023)   Hunger Vital Sign    Worried About Running Out of Food in the Last Year: Sometimes true    Ran Out of Food in the Last Year: Never true  Transportation Needs: No Transportation Needs (08/12/2023)   PRAPARE - Administrator, Civil Service (Medical): No    Lack of Transportation (Non-Medical): No  Physical Activity: Sufficiently Active (08/12/2023)   Exercise Vital Sign    Days of Exercise per Week: 6 days    Minutes of Exercise per Session: 60 min  Stress: No Stress Concern Present (08/12/2023)   [employer] of Occupational Health - Occupational Stress Questionnaire    Feeling of Stress : Only a little  Social Connections: Socially Isolated (08/12/2023)   Social Connection and Isolation Panel [NHANES]    Frequency of Communication with Friends and Family: Once a week    Frequency of Social Gatherings with Friends and Family: Once a week    Attends Religious Services: Never    Database administrator or Organizations: No    Attends Engineer, structural: Not on file    Marital Status: Never married  Intimate Partner Violence: Not on file    Review of Systems  Constitutional:  Negative for chills and fever.  Respiratory:  Negative for shortness of breath.   Cardiovascular:  Negative for chest pain and leg swelling.   Gastrointestinal:  Negative for abdominal pain, blood in stool, constipation, diarrhea, nausea and vomiting.       BM every morning to every other morning. She has linzess to use as needed  Genitourinary:  Negative for dysuria and hematuria.  Neurological:  Positive for dizziness. Negative for tingling and headaches.  Psychiatric/Behavioral:  Negative for hallucinations and suicidal ideas.         Objective    BP 126/82   Pulse (!) 59   Temp 97.7 F (36.5 C) (Oral)  Ht 5\' 3"  (1.6 m)   Wt 179 lb 3.2 oz (81.3 kg)   SpO2 97%   BMI 31.74 kg/m   Physical Exam Vitals and nursing note reviewed.  Constitutional:      Appearance: Normal appearance.  HENT:     Right Ear: Tympanic membrane, ear canal and external ear normal.     Left Ear: Tympanic membrane, ear canal and external ear normal.     Mouth/Throat:     Mouth: Mucous membranes are moist.     Pharynx: Oropharynx is clear.  Eyes:     Extraocular Movements: Extraocular movements intact.     Pupils: Pupils are equal, round, and reactive to light.  Cardiovascular:     Rate and Rhythm: Normal rate and regular rhythm.     Pulses: Normal pulses.     Heart sounds: Normal heart sounds.  Pulmonary:     Effort: Pulmonary effort is normal.     Breath sounds: Normal breath sounds.  Abdominal:     General: Bowel sounds are normal. There is no distension.     Palpations: There is no mass.     Tenderness: There is no abdominal tenderness.     Hernia: No hernia is present.  Musculoskeletal:     Right lower leg: No edema.     Left lower leg: No edema.  Lymphadenopathy:     Cervical: No cervical adenopathy.  Skin:    General: Skin is warm.  Neurological:     General: No focal deficit present.     Mental Status: She is alert.     Deep Tendon Reflexes:     Reflex Scores:      Bicep reflexes are 2+ on the right side and 2+ on the left side.      Patellar reflexes are 2+ on the right side and 2+ on the left side.    Comments:  Bilateral upper and lower extremity strength 5/5  Psychiatric:        Mood and Affect: Mood normal.        Behavior: Behavior normal.        Thought Content: Thought content normal.        Judgment: Judgment normal.         Assessment & Plan:   Problem List Items Addressed This Visit       Digestive   Gastroesophageal reflux disease   Patient has tried esomeprazole 20 and 40 mg for approximately 1 month.  Will switch patient to Protonix 40 mg daily for 30 days to see if this helps with symptomology      Relevant Medications   Na Sulfate-K Sulfate-Mg Sulfate concentrate (SUPREP) 17.5-3.13-1.6 GM/177ML SOLN   pantoprazole (PROTONIX) 40 MG tablet     Other   Anxiety   History of the same.  Patient denies HI/SI/AVH.  Patient uses alprazolam 0.25 mg nightly as needed.  90 tablets lasts for a year.  Patient had only refilled 30 tablets over this past year refill provided for patient      Relevant Medications   ALPRAZolam (NIRAVAM) 0.25 MG dissolvable tablet   Preventative health care - Primary   Discussed age-appropriate immunizations and screening exams.  Did review patient's personal, surgical, social, family histories.  Patient is up-to-date with all age-appropriate vaccinations she would like.  Patient deferred shingles vaccine in office today.  Order placed for CRC screening.  Order placed for mammogram for breast cancer screening and DEXA scan for osteoporosis screening.  Patient was given  information at discharge about preventative healthcare maintenance with anticipatory guidance.      Relevant Orders   CBC   Comprehensive metabolic panel with GFR   TSH   Prediabetes   History of the same pending A1c today      Relevant Orders   Hemoglobin A1c   Former smoker   Pending urine microscopy rule out microscopic hematuria      Relevant Orders   Urine Microscopic   Vitamin D deficiency   History of the same was repleted with prescription version vitamin D patient  requesting refill pending vitamin D level prior to refilling vitamin D      Relevant Orders   VITAMIN D 25 Hydroxy (Vit-D Deficiency, Fractures)   Other Visit Diagnoses       Encounter for screening for HIV       Relevant Orders   HIV antibody (with reflex)     Encounter for hepatitis C screening test for low risk patient       Relevant Orders   Hepatitis C Antibody     Screening for lipid disorders       Relevant Orders   Lipid panel     Screening for osteoporosis       Relevant Orders   DG Bone Density     Screening mammogram for breast cancer       Relevant Orders   MM 3D SCREENING MAMMOGRAM BILATERAL BREAST     Screening for colon cancer       Relevant Orders   Ambulatory referral to Gastroenterology       Return in about 6 months (around 02/13/2024) for GERD/OA/prediabetes .   Margarie Shay, NP

## 2023-08-13 NOTE — Telephone Encounter (Signed)
 Copied from CRM [id number]. Topic: Clinical - Medication Question >> Aug 13, 2023 12:01 PM Albertha Alosa wrote: Reason for CRM: [facility] called in regarding  ALPRAZolam (NIRAVAM) 0.25 MG dissolvable tablet stated they do not carry the dissolvable tablet, but they do have the pill could they do that instead call back at [phone]

## 2023-08-13 NOTE — Patient Instructions (Signed)
 Nice to see you today Follow up with me in 6 months, sooner if you need me  Call and schedule the mammogram and bone density scan at   [facility] [address] ( on hospital grounds) [place], [place]  [phone]

## 2023-08-13 NOTE — Telephone Encounter (Signed)
 Can we call the patient and let her know and see if she is ok with a normal pill that does not dissolve

## 2023-08-14 LAB — HEPATITIS C ANTIBODY: Hepatitis C Ab: NONREACTIVE

## 2023-08-14 LAB — HIV ANTIBODY (ROUTINE TESTING W REFLEX): HIV 1&2 Ab, 4th Generation: NONREACTIVE

## 2023-08-14 MED ORDER — ALPRAZOLAM 0.25 MG PO TABS: 0.2500 mg | ORAL_TABLET | Freq: Every evening | ORAL | 0 refills | Status: AC | PRN

## 2023-08-14 MED ORDER — SCOPOLAMINE 1 MG/3DAYS TD PT72: 1.0000 | MEDICATED_PATCH | TRANSDERMAL | 0 refills | Status: DC

## 2023-08-14 NOTE — Telephone Encounter (Unsigned)
 Copied from CRM [id number]. Topic: Clinical - Medication Question >> Aug 13, 2023 12:01 PM Albertha Alosa wrote: Reason for CRM: [facility] called in regarding  ALPRAZolam (NIRAVAM) 0.25 MG dissolvable tablet stated they do not carry the dissolvable tablet, but they do have the pill could they do that instead call back at [phone] >> Aug 14, 2023  8:52 AM Odilia Bennett D wrote: Patient called stating she never had dissolvable pills before, she only had regualr pills so she is ok with the normal pills. Patient also want to know if the doctor put in her patches for her cruise >> Aug 14, 2023  8:16 AM Howard Macho wrote: Valinda Gault from [facility] called wanting to see if the doctor can substitute the dissolvable tablets for ALPRAZolam for oral tablets  CB [phone]

## 2023-08-14 NOTE — Addendum Note (Signed)
 Addended by: Dorothe Gaster on: 08/14/2023 12:41 PM   Modules accepted: Orders

## 2023-08-14 NOTE — Telephone Encounter (Signed)
 Medications sent in.

## 2023-08-15 ENCOUNTER — Encounter: Payer: Self-pay | Admitting: Nurse Practitioner

## 2023-09-06 ENCOUNTER — Encounter: Payer: Self-pay | Admitting: Nurse Practitioner

## 2023-10-24 ENCOUNTER — Ambulatory Visit: Payer: Self-pay

## 2023-10-24 NOTE — Telephone Encounter (Signed)
 FYI Only or Action Required?: FYI only for provider.  Patient was last seen in primary care on 08/13/2023 by Wendee Lynwood HERO, NP.  Called Nurse Triage reporting Dental Pain.  Symptoms began a week ago.  Interventions attempted: OTC medications: Ibuprofen  800mg  BID.  Symptoms are: unchanged.  Triage Disposition: See Physician Within 24 Hours (overriding Call Dentist When Office is Open)  Patient/caregiver understands and will follow disposition?: Yes  Pt not able to see dentist until next week. Pt states unable if she has tooth infection or sinus infection wanting something to help with relief. No appts today, scheduled OV tomorrow at 0900.   Copied from CRM [id number]. Topic: Clinical - Red Word Triage >> Oct 24, 2023  8:48 AM Adelita E wrote: [facility] that prompted transfer to Nurse Triage: Tooth pain going on for about a week. Patient rated pain 8 out of 10, possibly relayed to sinuses, congestion and cough. Reason for Disposition  Toothache present > 24 hours  Answer Assessment - Initial Assessment Questions 1. LOCATION: Which tooth is hurting?  (e.g., right-side/left-side, upper/lower, front/back)     Left upper tooth pain  2. ONSET: When did the toothache start?  (e.g., hours, days)      1 week  3. SEVERITY: How bad is the toothache?  (Scale 1-10; mild, moderate or severe)     8/10 4. SWELLING: Is there any visible swelling of your face?     Not really  5. OTHER SYMPTOMS: Do you have any other symptoms? (e.g., fever)     Cough with congestion  Unable to get Dentist until next week  Taking Ibuprofen  800mg  BID  Protocols used: Toothache-A-AH

## 2023-10-24 NOTE — Telephone Encounter (Signed)
 Appointment with Dugal 10/25/2023 at 9:00 am.

## 2023-10-24 NOTE — Telephone Encounter (Signed)
 noted

## 2023-10-25 ENCOUNTER — Ambulatory Visit: Admitting: Family

## 2023-10-25 VITALS — BP 118/70 | HR 62 | Temp 97.9°F | Ht 63.0 in | Wt 182.0 lb

## 2023-10-25 DIAGNOSIS — M5134 Other intervertebral disc degeneration, thoracic region: Secondary | ICD-10-CM

## 2023-10-25 DIAGNOSIS — J0100 Acute maxillary sinusitis, unspecified: Secondary | ICD-10-CM | POA: Diagnosis not present

## 2023-10-25 MED ORDER — PREDNISONE 10 MG (21) PO TBPK: ORAL_TABLET | ORAL | 0 refills | Status: DC

## 2023-10-25 MED ORDER — IBUPROFEN 800 MG PO TABS: 800.0000 mg | ORAL_TABLET | Freq: Three times a day (TID) | ORAL | 0 refills | Status: DC | PRN

## 2023-10-25 MED ORDER — DOXYCYCLINE HYCLATE 100 MG PO TABS: 100.0000 mg | ORAL_TABLET | Freq: Two times a day (BID) | ORAL | 0 refills | Status: AC

## 2023-10-25 NOTE — Progress Notes (Signed)
 Established Patient Office Visit  Subjective:   Patient ID: Rachel Lewis, female    DOB: 1970/08/09  Age: 53 y.o. MRN: [id number]  CC:  Chief Complaint  Patient presents with   Respiratory Illness    Started about 1.5 weeks ago with sinus pain and pressure, facial pain, sneezing, cough.     HPI: Rachel Lewis is a 53 y.o. female presenting on 10/25/2023 for Respiratory Illness (Started about 1.5 weeks ago with sinus pain and pressure, facial pain, sneezing, cough. )  About 1.5 weeks ago started with sinus congestion and pressure, now with sneezing cough and facial pain. She started taking claritin without much relief. She does have a dry cough. No sore throat ear pain and or fever.        ROS: Negative unless specifically indicated above in HPI.   Relevant past medical history reviewed and updated as indicated.   Allergies and medications reviewed and updated.   Current Outpatient Medications:    ALPRAZolam  (XANAX ) 0.25 MG tablet, Take 1 tablet (0.25 mg total) by mouth at bedtime as needed for anxiety., Disp: 30 tablet, Rfl: 0   doxycycline (VIBRA-TABS) 100 MG tablet, Take 1 tablet (100 mg total) by mouth 2 (two) times daily for 10 days., Disp: 20 tablet, Rfl: 0   erythromycin  ophthalmic ointment, Place 1 Application into the left eye 4 (four) times daily., Disp: 3.5 g, Rfl: 0   meclizine  (ANTIVERT ) 25 MG tablet, Take 1 tablet (25 mg total) by mouth 3 (three) times daily as needed for dizziness., Disp: 30 tablet, Rfl: 0   Na Sulfate-K Sulfate-Mg Sulfate concentrate (SUPREP) 17.5-3.13-1.6 GM/177ML SOLN, Take 1 kit by mouth., Disp: , Rfl:    pantoprazole  (PROTONIX ) 40 MG tablet, Take 1 tablet (40 mg total) by mouth daily., Disp: 30 tablet, Rfl: 0   predniSONE (STERAPRED UNI-PAK 21 TAB) 10 MG (21) TBPK tablet, Take as directed, Disp: 1 each, Rfl: 0   scopolamine  (TRANSDERM-SCOP) 1 MG/3DAYS, Place 1 patch (1.5 mg total) onto the skin every 3 (three) days., Disp: 10 patch,  Rfl: 0   valACYclovir (VALTREX) 1000 MG tablet, 2 tabs q 12 hrs  1 day, Disp: , Rfl:    Vitamin D , Ergocalciferol , (DRISDOL) 1.25 MG (50000 UNIT) CAPS capsule, Take 50,000 Units by mouth once a week., Disp: , Rfl:    ibuprofen  (ADVIL ) 800 MG tablet, Take 1 tablet (800 mg total) by mouth every 8 (eight) hours as needed., Disp: 30 tablet, Rfl: 0  Allergies  Allergen Reactions   Amoxicillin-Pot Clavulanate Other (See Comments)    Gi upset   Citalopram Other (See Comments)    Stomach cramps    Objective:   BP 118/70 (BP Location: Left Arm, Patient Position: Sitting, Cuff Size: Large)   Pulse 62   Temp 97.9 F (36.6 C) (Oral)   Ht 5' 3 (1.6 m)   Wt 182 lb (82.6 kg)   SpO2 99%   BMI 32.24 kg/m    Physical Exam Constitutional:      General: She is not in acute distress.    Appearance: Normal appearance. She is normal weight. She is not ill-appearing, toxic-appearing or diaphoretic.  HENT:     Head: Normocephalic.     Right Ear: Tympanic membrane normal.     Left Ear: Tympanic membrane normal.     Nose:     Right Turbinates: Swollen.     Left Turbinates: Swollen.     Right Sinus: Maxillary sinus tenderness present.  Left Sinus: Maxillary sinus tenderness present.     Mouth/Throat:     Mouth: Mucous membranes are dry.     Pharynx: Posterior oropharyngeal erythema and postnasal drip present. No oropharyngeal exudate.  Eyes:     Extraocular Movements: Extraocular movements intact.     Pupils: Pupils are equal, round, and reactive to light.  Cardiovascular:     Rate and Rhythm: Normal rate and regular rhythm.     Pulses: Normal pulses.     Heart sounds: Normal heart sounds.  Pulmonary:     Effort: Pulmonary effort is normal.     Breath sounds: Normal breath sounds.  Musculoskeletal:     Cervical back: Normal range of motion.  Neurological:     General: No focal deficit present.     Mental Status: She is alert and oriented to person, place, and time. Mental status is at  baseline.  Psychiatric:        Mood and Affect: Mood normal.        Behavior: Behavior normal.        Thought Content: Thought content normal.        Judgment: Judgment normal.     Assessment & Plan:  Acute non-recurrent maxillary sinusitis Assessment & Plan: RX doxycycline 100 mg po bid x 10 days Rx prednisone pack take as directed Stop zyrtec and claritin, start xyzal and also flonase.   Pt to continue tylenol prn sinus pain. Warm compresses to sinus. Continue with humidifier prn and steam showers recommended as well. instructed If no symptom improvement in 48 hours please f/u   Orders: -     Doxycycline Hyclate; Take 1 tablet (100 mg total) by mouth 2 (two) times daily for 10 days.  Dispense: 20 tablet; Refill: 0 -     predniSONE; Take as directed  Dispense: 1 each; Refill: 0  Degenerative disc disease, thoracic Assessment & Plan: Pt requested refill ibuprofen  800 mg she states she takes only as needed when joint pains are really bad.  Sent in refill, advised pt do not take this and or other NSAIDS while taking prednisone.    Orders: -     Ibuprofen ; Take 1 tablet (800 mg total) by mouth every 8 (eight) hours as needed.  Dispense: 30 tablet; Refill: 0     Follow up plan: Return for f/u PCP if no improvement in symptoms.  Ginger Patrick, FNP

## 2023-10-25 NOTE — Assessment & Plan Note (Signed)
 RX doxycycline 100 mg po bid x 10 days Rx prednisone pack take as directed Stop zyrtec and claritin, start xyzal and also flonase.   Pt to continue tylenol prn sinus pain. Warm compresses to sinus. Continue with humidifier prn and steam showers recommended as well. instructed If no symptom improvement in 48 hours please f/u

## 2023-10-25 NOTE — Assessment & Plan Note (Signed)
 Pt requested refill ibuprofen  800 mg she states she takes only as needed when joint pains are really bad.  Sent in refill, advised pt do not take this and or other NSAIDS while taking prednisone.

## 2023-10-25 NOTE — Telephone Encounter (Signed)
 NOTED

## 2023-11-14 ENCOUNTER — Ambulatory Visit
Admission: RE | Admit: 2023-11-14 | Discharge: 2023-11-14 | Disposition: A | Discharge status: Home / Self Care | Source: Ambulatory Visit | Attending: Nurse Practitioner

## 2023-11-14 ENCOUNTER — Ambulatory Visit
Admission: RE | Admit: 2023-11-14 | Discharge: 2023-11-14 | Disposition: A | Discharge status: Home / Self Care | Source: Ambulatory Visit | Attending: Nurse Practitioner | Admitting: Nurse Practitioner

## 2023-11-14 DIAGNOSIS — Z13820 Encounter for screening for osteoporosis: Secondary | ICD-10-CM | POA: Insufficient documentation

## 2023-11-14 DIAGNOSIS — Z1231 Encounter for screening mammogram for malignant neoplasm of breast: Secondary | ICD-10-CM | POA: Diagnosis present

## 2023-11-18 ENCOUNTER — Ambulatory Visit: Payer: Self-pay | Admitting: Nurse Practitioner

## 2023-11-18 DIAGNOSIS — M8580 Other specified disorders of bone density and structure, unspecified site: Secondary | ICD-10-CM

## 2024-02-18 ENCOUNTER — Ambulatory Visit: Admitting: Nurse Practitioner

## 2024-02-18 ENCOUNTER — Encounter: Payer: Self-pay | Admitting: Emergency Medicine

## 2024-02-18 VITALS — BP 138/86 | HR 57 | Temp 98.0°F | Ht 63.0 in | Wt 179.2 lb

## 2024-02-18 DIAGNOSIS — R7303 Prediabetes: Secondary | ICD-10-CM

## 2024-02-18 DIAGNOSIS — Z87891 Personal history of nicotine dependence: Secondary | ICD-10-CM | POA: Diagnosis not present

## 2024-02-18 DIAGNOSIS — Z23 Encounter for immunization: Secondary | ICD-10-CM | POA: Diagnosis not present

## 2024-02-18 DIAGNOSIS — Z1211 Encounter for screening for malignant neoplasm of colon: Secondary | ICD-10-CM

## 2024-02-18 DIAGNOSIS — Z122 Encounter for screening for malignant neoplasm of respiratory organs: Secondary | ICD-10-CM

## 2024-02-18 DIAGNOSIS — K219 Gastro-esophageal reflux disease without esophagitis: Secondary | ICD-10-CM

## 2024-02-18 DIAGNOSIS — M5134 Other intervertebral disc degeneration, thoracic region: Secondary | ICD-10-CM

## 2024-02-18 DIAGNOSIS — E559 Vitamin D deficiency, unspecified: Secondary | ICD-10-CM

## 2024-02-18 LAB — HEMOGLOBIN A1C: Hgb A1c MFr Bld: 5.8 % (ref 4.6–6.5)

## 2024-02-18 MED ORDER — IBUPROFEN 800 MG PO TABS: 800.0000 mg | ORAL_TABLET | Freq: Three times a day (TID) | ORAL | 0 refills | Status: AC | PRN

## 2024-02-18 MED ORDER — PANTOPRAZOLE SODIUM 40 MG PO TBEC: 40.0000 mg | DELAYED_RELEASE_TABLET | Freq: Every day | ORAL | 0 refills | Status: DC

## 2024-02-18 NOTE — Progress Notes (Signed)
 Established Patient Office Visit  Subjective   Patient ID: Rachel Lewis, female    DOB: 09-11-1970  Age: 53 y.o. MRN: [id number]  Chief Complaint  Patient presents with   Follow-up    Pt complains that GERD is really bad again. States that she needs a refill for pantoprazole  and ibuprofen  800mg .     HPI  Discussed the use of AI scribe software for clinical note transcription with the patient, who gave verbal consent to proceed.  History of Present Illness Rachel Lewis is a 53 year old female who presents for follow-up on her medication regimen.  She experiences heartburn and GERD symptoms, with the most severe episodes occurring when lying down at night. She previously tried Nexium without success but found significant relief with Protonix  (pantoprazole ). She initially took the medication only as needed but now takes it daily. She has not identified specific dietary triggers but has reduced her coke intake to one 8-ounce serving per day due to concerns about her A1c levels.  She occasionally uses ibuprofen  800 mg for pain, primarily for dental pain, taking it every two to three weeks as needed. She still has a few tablets left from a previous prescription.  She has a history of smoking, having quit in 2017 after smoking for approximately 30 years. She smoked up to a pack and a half per day initially, reducing to a pack a week in the last few years before quitting. She experiences a persistent cough, which she attributes to past smoking.  She is concerned about her A1c levels, especially after recent dietary indulgences during a trip to [place]. She is worried about the risk of diabetes and its implications for her cardiovascular health. Her cholesterol was previously noted to be slightly elevated at 119 mg/dL.  She has not yet undergone a colonoscopy due to work constraints but is considering alternative screening methods. 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     Review of Systems  Constitutional:  Negative for chills and fever.  Respiratory:  Negative for shortness of breath.   Cardiovascular:  Negative for chest pain.      Objective:     BP 138/86   Pulse (!) 57   Temp 98 F (36.7 C) (Oral)   Ht 5' 3 (1.6 m)   Wt 179 lb 3.2 oz (81.3 kg)   SpO2 98%   BMI 31.74 kg/m  BP Readings from Last 3 Encounters:  02/18/24 138/86  10/25/23 118/70  08/13/23 126/82   Wt Readings from Last 3 Encounters:  02/18/24 179 lb 3.2 oz (81.3 kg)  10/25/23 182 lb (82.6 kg)  08/13/23 179 lb 3.2 oz (81.3 kg)   SpO2 Readings from Last 3 Encounters:  02/18/24 98%  10/25/23 99%  08/13/23 97%      Physical Exam Vitals and nursing note reviewed.  Constitutional:      Appearance: Normal appearance.  Cardiovascular:     Rate and Rhythm: Normal rate and regular rhythm.     Heart sounds: Normal heart sounds.  Pulmonary:     Effort: Pulmonary effort is normal.     Breath sounds: Normal breath sounds.  Neurological:     Mental Status: She is alert.      No results found for any visits on 02/18/24.    The 10-year ASCVD risk score (Arnett DK, et al., 2019) is: 2.2%  Assessment & Plan:   Problem List Items Addressed This Visit       Digestive   Gastroesophageal reflux disease   Relevant Medications   pantoprazole  (PROTONIX ) 40 MG tablet     Musculoskeletal and Integument   Degenerative disc disease, thoracic   Relevant Medications   ibuprofen  (ADVIL ) 800 MG tablet     Other   Prediabetes   Relevant Orders   Hemoglobin A1c   Former smoker   Vitamin D  deficiency   Other Visit Diagnoses       Screening for lung cancer    -  Primary   Relevant Orders   Ambulatory Referral Lung Cancer Screening [facility]     Need for pneumococcal 20-valent conjugate vaccination       Relevant Orders   Pneumococcal  conjugate vaccine 20-valent (Prevnar 20) (Completed)     Screening for colon cancer       Relevant Orders   Cologuard     Need for influenza vaccination       Relevant Orders   Flu vaccine trivalent PF, 6mos and older(Flulaval,Afluria,Fluarix,Fluzone) (Completed)     Assessment and Plan Assessment & Plan Gastroesophageal reflux disease GERD symptoms improved with pantoprazole  40 mg. Symptoms worsen when lying down. No specific dietary triggers identified. Previous medications ineffective. - Continue pantoprazole  40 mg daily for 30 days. - Reduce pantoprazole  to 20 mg daily after 30 days if symptoms controlled. - Reassess symptoms after dose reduction. - Educated on daily use of pantoprazole  for optimal efficacy.  Prediabetes Concern about blood sugar levels due to dietary indiscretions. Previous A1c levels elevated. Emphasized maintaining blood sugar to reduce cardiovascular risk. - Rechecked A1c today. - Encouraged dietary modifications to manage blood sugar levels.  Nicotine dependence, in remission Quit smoking in 2017 after 30-year history. Experiences coughing likely due to past smoking. Discussed potential lung damage and insurance coverage for CT scan. - Ordered CT scan for lung cancer screening if covered by insurance. - Administered pneumonia vaccine due to smoking history and increased risk of respiratory infections.  General Health Maintenance Discussed importance of vaccinations and screenings. Due for flu shot and pneumonia vaccine. Cologuard test recommended for colorectal cancer screening. - Administered flu shot today. - Administered pneumonia vaccine today. - Ordered Cologuard test for colorectal cancer screening.   Return in about 6 months (around 08/17/2024) for CPE and Labs.    Rachel Crandall, NP

## 2024-02-18 NOTE — Patient Instructions (Signed)
 Nice to see you today I have sent medications to the pharmacy  Follow up with me in approx 6 months for your physical and labs, sooner if you need me  We did up date your flu and pneumonia vaccine today

## 2024-02-21 ENCOUNTER — Ambulatory Visit: Payer: Self-pay | Admitting: Nurse Practitioner

## 2024-03-09 ENCOUNTER — Telehealth: Payer: Self-pay | Admitting: Nurse Practitioner

## 2024-03-09 DIAGNOSIS — K219 Gastro-esophageal reflux disease without esophagitis: Secondary | ICD-10-CM

## 2024-03-09 NOTE — Telephone Encounter (Signed)
 Call and see how her heartburn/GERD is doing on the protonix  40mg  daily

## 2024-03-09 NOTE — Telephone Encounter (Signed)
-----   Message from [facility] sent at 02/18/2024 10:51 AM EST ----- Regarding: GERD Call and see how her heartburn/GERD is doing on the protonix  40mg  daily

## 2024-03-09 NOTE — Telephone Encounter (Signed)
 Left voicemail for patient to call the office back.

## 2024-03-10 NOTE — Telephone Encounter (Signed)
 Called and spoke with patient.  She states that the Protonix  has been working for heartburn/GERD.  Pt expressed no concerns and has no questions at this time.

## 2024-03-12 MED ORDER — PANTOPRAZOLE SODIUM 20 MG PO TBEC: 20.0000 mg | DELAYED_RELEASE_TABLET | Freq: Every day | ORAL | 0 refills | Status: AC

## 2024-03-12 NOTE — Addendum Note (Signed)
 Addended by: WENDEE LYNWOOD HERO on: 03/12/2024 02:23 PM   Modules accepted: Orders

## 2024-03-12 NOTE — Telephone Encounter (Signed)
 Since symptoms are controlled we will try and reduce to Protonix  20 mg daily. I have sent in the update prescription

## 2024-03-12 NOTE — Telephone Encounter (Signed)
 Left voicemail for patient to call the office back.

## 2024-03-13 NOTE — Telephone Encounter (Signed)
 Called and spoke with patient.  Pt is aware of medication update and has no questions or concerns.

## 2024-03-16 LAB — COLOGUARD: COLOGUARD: NEGATIVE

## 2024-08-19 ENCOUNTER — Encounter: Admitting: Nurse Practitioner
